# Patient Record
Sex: Female | Born: 1990
Health system: Southern US, Community
[De-identification: ages and names within clinical notes are randomized; demographics above are authoritative.]

## PROBLEM LIST (undated history)

## (undated) ENCOUNTER — Inpatient Hospital Stay (HOSPITAL_COMMUNITY): Payer: Self-pay

## (undated) DIAGNOSIS — F419 Anxiety disorder, unspecified: Secondary | ICD-10-CM

## (undated) DIAGNOSIS — K8020 Calculus of gallbladder without cholecystitis without obstruction: Secondary | ICD-10-CM

## (undated) DIAGNOSIS — U071 COVID-19: Secondary | ICD-10-CM

## (undated) DIAGNOSIS — K219 Gastro-esophageal reflux disease without esophagitis: Secondary | ICD-10-CM

## (undated) DIAGNOSIS — E669 Obesity, unspecified: Secondary | ICD-10-CM

## (undated) HISTORY — DX: Obesity, unspecified: E66.9

## (undated) HISTORY — PX: NO PAST SURGERIES: SHX2092

## (undated) HISTORY — DX: Gastro-esophageal reflux disease without esophagitis: K21.9

## (undated) HISTORY — PX: WISDOM TOOTH EXTRACTION: SHX21

## (undated) HISTORY — DX: Calculus of gallbladder without cholecystitis without obstruction: K80.20

---

## 2012-06-30 NOTE — L&D Delivery Note (Signed)
Delivery Note   Suzanne [name], Suzanne [name] [[id number]]  Pt progressed to complete and pushed for about 2 hrs, brought vertex to +2 to +3, became very tired.  Offered vacuum assistance, discussed procedure and risks, verbal consent obtained. Adequate epidural, bladder empty.  Mushroom cup applied, with 2 ctx vertex brought to perineum with vacuum in green zone.  Vacuum removed and vertex delivered.   At 2:23 AM a viable female was delivered via Vaginal, Vacuum (Extractor) (Presentation: Left Occiput Posterior).  APGAR: 9, 9; weight 5 lb 14 oz (2665 g).   Placenta status: Intact, Spontaneous.  Cord: 3 vessels with the following complications: None.     Suzanne [name], Suzanne [name] [[id number]]  Baby B remained vertex, AROM clear fluid.  She only had to push with one ctx.  At 2:29 AM a viable female was delivered via Vaginal, Spontaneous Delivery (Presentation: Middle Occiput Anterior).  APGAR: 9, 9; weight 6 lb 0.1 oz (2724 g).   Placenta status: Intact, Spontaneous.  Cord: 3 vessels with the following complications: None.  Anesthesia: Epidural  Episiotomy: None Lacerations: 2nd degree and left vaginal sulcus Suture Repair: 2.0 vicryl and 3-0 Vicryl Rapide Est. Blood Loss (mL): 500   Mom to postpartum.   Baby A to stay with mom.   Baby B to stay with mom. Discussed circumcision procedure and risks, consent signed.    Vlasta Baskin D 01/14/2013, 3:07 AM

## 2012-07-20 LAB — OB RESULTS CONSOLE ABO/RH: RH Type: POSITIVE

## 2012-11-25 ENCOUNTER — Other Ambulatory Visit (HOSPITAL_COMMUNITY): Payer: Self-pay | Admitting: Obstetrics and Gynecology

## 2012-11-25 DIAGNOSIS — Z3183 Encounter for assisted reproductive fertility procedure cycle: Secondary | ICD-10-CM

## 2012-11-30 ENCOUNTER — Ambulatory Visit (HOSPITAL_COMMUNITY): Admission: RE | Admit: 2012-11-30 | Payer: Self-pay | Source: Ambulatory Visit

## 2012-12-28 LAB — OB RESULTS CONSOLE GC/CHLAMYDIA
Chlamydia: NEGATIVE
Gonorrhea: NEGATIVE

## 2012-12-28 LAB — OB RESULTS CONSOLE RPR: RPR: NONREACTIVE

## 2012-12-28 LAB — OB RESULTS CONSOLE GBS: GBS: NEGATIVE

## 2012-12-29 ENCOUNTER — Inpatient Hospital Stay (HOSPITAL_COMMUNITY)
Admission: AD | Admit: 2012-12-29 | Discharge: 2012-12-29 | Disposition: A | Discharge status: Home / Self Care | Payer: BC Managed Care – PPO | Source: Ambulatory Visit | Attending: Obstetrics and Gynecology | Admitting: Obstetrics and Gynecology

## 2012-12-29 ENCOUNTER — Encounter (HOSPITAL_COMMUNITY): Payer: Self-pay

## 2012-12-29 DIAGNOSIS — O4703 False labor before 37 completed weeks of gestation, third trimester: Secondary | ICD-10-CM

## 2012-12-29 DIAGNOSIS — O4700 False labor before 37 completed weeks of gestation, unspecified trimester: Secondary | ICD-10-CM | POA: Insufficient documentation

## 2012-12-29 DIAGNOSIS — O30009 Twin pregnancy, unspecified number of placenta and unspecified number of amniotic sacs, unspecified trimester: Secondary | ICD-10-CM | POA: Insufficient documentation

## 2012-12-29 DIAGNOSIS — O479 False labor, unspecified: Secondary | ICD-10-CM

## 2012-12-29 NOTE — MAU Note (Signed)
Rates ctx's 6/10, intermittent

## 2012-12-29 NOTE — MAU Provider Note (Signed)
  History     CSN: [id number]  Arrival date and time: 12/29/12 0605   First Provider Initiated Contact with Patient 12/29/12 [phone]      Chief Complaint  Patient presents with  . Contractions   HPI Ms. Suzanne [name] is a 22 y.o. G1P0 at [redacted]w[redacted]d with twins who presents to MAU today with complaint of contractions. The patient states UCs started around 0300 and are q 6 minutes. She rates her pain at 7/10 with contractions. She was dilated 1 cm in the office yesterday. She denies bleeding, discharge or LOF. She reports good fetal movement.   OB History   Grav Para Term Preterm Abortions TAB SAB Ect Mult Living   1               Past Medical History  Diagnosis Date  . Medical history non-contributory     Past Surgical History  Procedure Laterality Date  . No past surgeries      History reviewed. No pertinent family history.  History  Substance Use Topics  . Smoking status: Never Smoker   . Smokeless tobacco: Not on file  . Alcohol Use: No    Allergies: No Known Allergies  Prescriptions prior to admission  Medication Sig Dispense Refill  . Prenatal Vit-Fe Fumarate-FA (MULTIVITAMIN-PRENATAL) 27-0.8 MG TABS Take 1 tablet by mouth daily at 12 noon.        Review of Systems  Gastrointestinal: Positive for abdominal pain.  Genitourinary:       Neg - bleeding, discharge, LOF   Physical Exam   Blood pressure 109/74, pulse 121, temperature 98.6 F (37 C), temperature source Oral, resp. rate 18, last menstrual period 04/23/2012, SpO2 100.00%.  Physical Exam  Constitutional: She is oriented to person, place, and time. She appears well-developed and well-nourished. No distress.  HENT:  Head: Normocephalic and atraumatic.  Cardiovascular: Normal rate, regular rhythm and normal heart sounds.   Respiratory: Effort normal and breath sounds normal. No respiratory distress.  GI: Soft. She exhibits no distension and no mass. There is no tenderness. There is no rebound and no  guarding.  Neurological: She is alert and oriented to person, place, and time.  Skin: Skin is warm and dry. No erythema.  Psychiatric: She has a normal mood and affect.  Dilation: 1 Effacement (%): 20;30 Cervical Position: Posterior Station: -3 Exam by:: Naaman Plummer PA   Fetal Monitoring: Baseline A:130, moderate variability, + accelerations, no decelerations Baseline B: 135, moderate variability, + accelerations, no decelerations Contractions: q 2-5 minutes  MAU Course  Procedures None  MDM Discussed with Dr. Senaida Ores. Ok for discharge with labor precautions. Follow-up in office as scheduled or sooner if symptoms worsen or persist.  Ok to offer Ambien if patient desires. Patient declines at this time.  Assessment and Plan  A: Preterm contractions, third trimester  P: Discharge home Labor precautions and kick counts discussed Patient encouraged to follow-up in the office as scheduled or sooner if symptoms worsen or persist Patient may return to MAU as needed  Freddi Starr, PA-C  12/29/2012, 7:18 AM

## 2012-12-29 NOTE — MAU Note (Signed)
Pt states UC began around 430am and are every 5 minutes apart. Denies LOF and vaginal bleeding. States good FM.

## 2013-01-04 ENCOUNTER — Encounter (HOSPITAL_COMMUNITY): Payer: Self-pay | Admitting: *Deleted

## 2013-01-04 ENCOUNTER — Telehealth (HOSPITAL_COMMUNITY): Payer: Self-pay | Admitting: *Deleted

## 2013-01-04 NOTE — Telephone Encounter (Signed)
Preadmission screen  

## 2013-01-13 ENCOUNTER — Inpatient Hospital Stay (HOSPITAL_COMMUNITY)
Admission: RE | Admit: 2013-01-13 | Discharge: 2013-01-16 | DRG: 373 | Disposition: A | Discharge status: Home / Self Care | Payer: BC Managed Care – PPO | Source: Ambulatory Visit | Attending: Obstetrics and Gynecology | Admitting: Obstetrics and Gynecology

## 2013-01-13 ENCOUNTER — Encounter (HOSPITAL_COMMUNITY): Payer: Self-pay

## 2013-01-13 ENCOUNTER — Inpatient Hospital Stay (HOSPITAL_COMMUNITY): Payer: BC Managed Care – PPO | Admitting: Anesthesiology

## 2013-01-13 ENCOUNTER — Encounter (HOSPITAL_COMMUNITY): Payer: Self-pay | Admitting: Anesthesiology

## 2013-01-13 DIAGNOSIS — O30049 Twin pregnancy, dichorionic/diamniotic, unspecified trimester: Secondary | ICD-10-CM

## 2013-01-13 DIAGNOSIS — O30009 Twin pregnancy, unspecified number of placenta and unspecified number of amniotic sacs, unspecified trimester: Secondary | ICD-10-CM | POA: Diagnosis present

## 2013-01-13 LAB — CBC
HCT: 31.6 % — ABNORMAL LOW (ref 36.0–46.0)
Hemoglobin: 10.3 g/dL — ABNORMAL LOW (ref 12.0–15.0)
MCH: 23.8 pg — ABNORMAL LOW (ref 26.0–34.0)
MCHC: 32.6 g/dL (ref 30.0–36.0)
RDW: 15.2 % (ref 11.5–15.5)

## 2013-01-13 LAB — TYPE AND SCREEN

## 2013-01-13 MED ORDER — PHENYLEPHRINE 40 MCG/ML (10ML) SYRINGE FOR IV PUSH (FOR BLOOD PRESSURE SUPPORT)
80.0000 ug | PREFILLED_SYRINGE | INTRAVENOUS | Status: DC | PRN
  Filled 2013-01-13: qty 2

## 2013-01-13 MED ORDER — OXYTOCIN 40 UNITS IN LACTATED RINGERS INFUSION - SIMPLE MED
1.0000 m[IU]/min | INTRAVENOUS | Status: DC
  Administered 2013-01-13: 2 m[IU]/min via INTRAVENOUS
  Filled 2013-01-13: qty 1000

## 2013-01-13 MED ORDER — LACTATED RINGERS IV SOLN
500.0000 mL | INTRAVENOUS | Status: DC | PRN
  Administered 2013-01-13: 700 mL via INTRAVENOUS
  Administered 2013-01-14: 500 mL via INTRAVENOUS

## 2013-01-13 MED ORDER — CITRIC ACID-SODIUM CITRATE 334-500 MG/5ML PO SOLN: 30.0000 mL | ORAL | Status: DC | PRN

## 2013-01-13 MED ORDER — LACTATED RINGERS IV SOLN
INTRAVENOUS | Status: DC
  Administered 2013-01-13 (×3): via INTRAVENOUS

## 2013-01-13 MED ORDER — OXYCODONE-ACETAMINOPHEN 5-325 MG PO TABS: 1.0000 | ORAL_TABLET | ORAL | Status: DC | PRN

## 2013-01-13 MED ORDER — DIPHENHYDRAMINE HCL 50 MG/ML IJ SOLN: 12.5000 mg | INTRAMUSCULAR | Status: DC | PRN

## 2013-01-13 MED ORDER — TERBUTALINE SULFATE 1 MG/ML IJ SOLN: 0.2500 mg | Freq: Once | INTRAMUSCULAR | Status: AC | PRN

## 2013-01-13 MED ORDER — FENTANYL 2.5 MCG/ML BUPIVACAINE 1/10 % EPIDURAL INFUSION (WH - ANES)
14.0000 mL/h | INTRAMUSCULAR | Status: DC | PRN
  Filled 2013-01-13: qty 125

## 2013-01-13 MED ORDER — OXYTOCIN BOLUS FROM INFUSION
500.0000 mL | INTRAVENOUS | Status: DC
  Administered 2013-01-14: 500 mL via INTRAVENOUS

## 2013-01-13 MED ORDER — ONDANSETRON HCL 4 MG/2ML IJ SOLN: 4.0000 mg | Freq: Four times a day (QID) | INTRAMUSCULAR | Status: DC | PRN

## 2013-01-13 MED ORDER — EPHEDRINE 5 MG/ML INJ
10.0000 mg | INTRAVENOUS | Status: DC | PRN
  Filled 2013-01-13: qty 2

## 2013-01-13 MED ORDER — EPHEDRINE 5 MG/ML INJ
10.0000 mg | INTRAVENOUS | Status: DC | PRN
  Filled 2013-01-13 (×2): qty 4
  Filled 2013-01-13: qty 2

## 2013-01-13 MED ORDER — LACTATED RINGERS IV SOLN: 500.0000 mL | Freq: Once | INTRAVENOUS | Status: DC

## 2013-01-13 MED ORDER — FENTANYL 2.5 MCG/ML BUPIVACAINE 1/10 % EPIDURAL INFUSION (WH - ANES)
INTRAMUSCULAR | Status: DC | PRN
  Administered 2013-01-13: 6 mL/h via EPIDURAL

## 2013-01-13 MED ORDER — ACETAMINOPHEN 325 MG PO TABS: 650.0000 mg | ORAL_TABLET | ORAL | Status: DC | PRN

## 2013-01-13 MED ORDER — OXYTOCIN 40 UNITS IN LACTATED RINGERS INFUSION - SIMPLE MED: 62.5000 mL/h | INTRAVENOUS | Status: DC

## 2013-01-13 MED ORDER — IBUPROFEN 600 MG PO TABS: 600.0000 mg | ORAL_TABLET | Freq: Four times a day (QID) | ORAL | Status: DC | PRN

## 2013-01-13 MED ORDER — PHENYLEPHRINE 40 MCG/ML (10ML) SYRINGE FOR IV PUSH (FOR BLOOD PRESSURE SUPPORT)
80.0000 ug | PREFILLED_SYRINGE | INTRAVENOUS | Status: DC | PRN
  Filled 2013-01-13 (×2): qty 5
  Filled 2013-01-13: qty 2

## 2013-01-13 MED ORDER — LIDOCAINE HCL (PF) 1 % IJ SOLN
30.0000 mL | INTRAMUSCULAR | Status: AC | PRN
  Administered 2013-01-13 (×2): 3 mL via SUBCUTANEOUS

## 2013-01-13 NOTE — Progress Notes (Signed)
Feeling ctx Afeb, VSS FHT- Cat I x 2, ctx q 3 min VE- 3/70/-2, vtx, AROM clear Continue pitocin and monitor progress

## 2013-01-13 NOTE — Anesthesia Preprocedure Evaluation (Signed)
Anesthesia Evaluation  Patient identified by MRN, date of birth, ID band Patient awake    Reviewed: Allergy & Precautions, H&P , NPO status , Patient's Chart, lab work & pertinent test results  Airway Mallampati: III TM Distance: >3 FB Neck ROM: full    Dental no notable dental hx.    Pulmonary neg pulmonary ROS,    Pulmonary exam normal       Cardiovascular negative cardio ROS  Rate:Normal     Neuro/Psych negative neurological ROS  negative psych ROS   GI/Hepatic negative GI ROS, Neg liver ROS,   Endo/Other  Morbid obesity  Renal/GU negative Renal ROS  negative genitourinary   Musculoskeletal negative musculoskeletal ROS (+)   Abdominal (+) + obese,   Peds negative pediatric ROS (+)  Hematology negative hematology ROS (+)   Anesthesia Other Findings   Reproductive/Obstetrics (+) Pregnancy                           Anesthesia Physical Anesthesia Plan  ASA: III  Anesthesia Plan: Epidural   Post-op Pain Management:    Induction:   Airway Management Planned:   Additional Equipment:   Intra-op Plan:   Post-operative Plan:   Informed Consent: I have reviewed the patients History and Physical, chart, labs and discussed the procedure including the risks, benefits and alternatives for the proposed anesthesia with the patient or authorized representative who has indicated his/her understanding and acceptance.     Plan Discussed with:   Anesthesia Plan Comments:         Anesthesia Quick Evaluation

## 2013-01-13 NOTE — Anesthesia Procedure Notes (Signed)
Epidural Patient location during procedure: OB Start time: 01/13/2013 4:50 PM End time: 01/13/2013 4:56 PM  Staffing Anesthesiologist: Sandrea Hughs Performed by: other anesthesia staff   Preanesthetic Checklist Completed: patient identified, surgical consent, pre-op evaluation, timeout performed, IV checked, risks and benefits discussed and monitors and equipment checked  Epidural Patient position: sitting Prep: site prepped and draped and DuraPrep Patient monitoring: continuous pulse ox and blood pressure Approach: midline Injection technique: LOR air  Needle:  Needle type: Tuohy  Needle gauge: 17 G Needle length: 9 cm and 9 Needle insertion depth: 8 cm Catheter type: closed end flexible Catheter size: 19 Gauge Catheter at skin depth: 13 cm Test dose: negative and Other  Assessment Sensory level: T10 Events: blood not aspirated, injection not painful, no injection resistance, negative IV test and no paresthesia  Additional Notes After clearing the end of the needle with small stylet CSF appeared flowing out of the hub. The needle was immediately withdrawn to - CSF and the epidural catheter was placed. The needle was withdrawn one cm and is currently 13cm at the skin or 6cm in the epidural space. Pt had no response to the first 3cc of 1% lido through the epidural catheter but had no pain with contractions after the second dose of the same medicine. BP remained stable in the 120-130/ 80 - 90 after dosing. Patient was notified that she was at risk for having a headache.Reason for block:procedure for pain

## 2013-01-13 NOTE — H&P (Signed)
Suzanne [name] is a 22 y.o. female, G1P0, EGA 37+ weeks with diamniotic/dichorionic twins with EDC 8-1 presenting for elective induction with favorable cervix.  Concordant growth of twins, vtx/vtx, no problems with pregnancy.  See prenatal records for complete history.  Maternal Medical History:  Fetal activity: Perceived fetal activity is normal.      OB History   Grav Para Term Preterm Abortions TAB SAB Ect Mult Living   1              Past Medical History  Diagnosis Date  . Medical history non-contributory    Past Surgical History  Procedure Laterality Date  . No past surgeries     Family History: family history includes Diabetes in her father. Social History:  reports that she has never smoked. She has never used smokeless tobacco. She reports that she does not drink alcohol or use illicit drugs.   Prenatal Transfer Tool  Maternal Diabetes: No Genetic Screening: Normal Maternal Ultrasounds/Referrals: Normal Fetal Ultrasounds or other Referrals:  None Maternal Substance Abuse:  No Significant Maternal Medications:  None Significant Maternal Lab Results:  Lab values include: Group B Strep negative Other Comments:  di/di twins  Review of Systems  Respiratory: Negative.   Cardiovascular: Negative.       Blood pressure 119/86, pulse 107, temperature 98.8 F (37.1 C), temperature source Oral, resp. rate 20, height 5\' 3"  (1.6 m), weight 113.399 kg (250 lb), last menstrual period 04/23/2012. Maternal Exam:  Uterine Assessment: Contraction strength is mild.  Contraction frequency is irregular.   Abdomen: Patient reports no abdominal tenderness. Fetal presentation: vertex  Introitus: Normal vulva. Normal vagina.  Pelvis: adequate for delivery.   Cervix: Cervix evaluated by digital exam.     Fetal Exam Fetal Monitor Review: Mode: ultrasound.   Baseline rate: 130 and 140.  Variability: moderate (6-25 bpm).   Pattern: accelerations present and no decelerations.    Fetal  State Assessment: Category I - tracings are normal.     Physical Exam  Constitutional: She appears well-developed and well-nourished.  Cardiovascular: Normal rate, regular rhythm and normal heart sounds.   No murmur heard. Respiratory: Effort normal and breath sounds normal. No respiratory distress. She has no wheezes.  GI: Soft.  gravid    Prenatal labs: ABO, Rh: A/Positive/-- (01/21 0000) Antibody: Negative (01/21 0000) Rubella: Immune (01/21 0000) RPR: Nonreactive (07/01 0000)  HBsAg: Negative (01/21 0000)  HIV: Non-reactive (07/01 0000)  GBS: Negative (07/01 0000)  GCT:  94  Assessment/Plan: Di/di twin IUP, vtx/vtx for induction.  Will start pitocin and monitor progress, anticipate SVD in the room.     Gali Spinney D 01/13/2013, 8:26 AM

## 2013-01-13 NOTE — Progress Notes (Signed)
Just got epidural Afeb, VSS FHT- Cat I x 2, ctx q 2-3 min VE-5/70/-2 vtx per RN Continue pitocin, monitor progress

## 2013-01-14 ENCOUNTER — Encounter (HOSPITAL_COMMUNITY): Payer: Self-pay

## 2013-01-14 MED ORDER — BENZOCAINE-MENTHOL 20-0.5 % EX AERO
1.0000 "application " | INHALATION_SPRAY | CUTANEOUS | Status: DC | PRN
  Administered 2013-01-14: 1 via TOPICAL
  Filled 2013-01-14: qty 56

## 2013-01-14 MED ORDER — DIPHENHYDRAMINE HCL 25 MG PO CAPS: 25.0000 mg | ORAL_CAPSULE | Freq: Four times a day (QID) | ORAL | Status: DC | PRN

## 2013-01-14 MED ORDER — SIMETHICONE 80 MG PO CHEW: 80.0000 mg | CHEWABLE_TABLET | ORAL | Status: DC | PRN

## 2013-01-14 MED ORDER — LIDOCAINE HCL (PF) 1 % IJ SOLN
INTRAMUSCULAR | Status: AC
  Administered 2013-01-14: 30 mL
  Filled 2013-01-14: qty 30

## 2013-01-14 MED ORDER — METHYLERGONOVINE MALEATE 0.2 MG PO TABS: 0.2000 mg | ORAL_TABLET | ORAL | Status: DC | PRN

## 2013-01-14 MED ORDER — SENNOSIDES-DOCUSATE SODIUM 8.6-50 MG PO TABS
2.0000 | ORAL_TABLET | Freq: Every day | ORAL | Status: DC
  Administered 2013-01-14 – 2013-01-15 (×2): 2 via ORAL

## 2013-01-14 MED ORDER — MAGNESIUM HYDROXIDE 400 MG/5ML PO SUSP: 30.0000 mL | ORAL | Status: DC | PRN

## 2013-01-14 MED ORDER — MEASLES, MUMPS & RUBELLA VAC ~~LOC~~ INJ
0.5000 mL | INJECTION | Freq: Once | SUBCUTANEOUS | Status: DC
  Filled 2013-01-14: qty 0.5

## 2013-01-14 MED ORDER — ZOLPIDEM TARTRATE 5 MG PO TABS: 5.0000 mg | ORAL_TABLET | Freq: Every evening | ORAL | Status: DC | PRN

## 2013-01-14 MED ORDER — ONDANSETRON HCL 4 MG/2ML IJ SOLN: 4.0000 mg | INTRAMUSCULAR | Status: DC | PRN

## 2013-01-14 MED ORDER — LANOLIN HYDROUS EX OINT: TOPICAL_OINTMENT | CUTANEOUS | Status: DC | PRN

## 2013-01-14 MED ORDER — DIBUCAINE 1 % RE OINT: 1.0000 "application " | TOPICAL_OINTMENT | RECTAL | Status: DC | PRN

## 2013-01-14 MED ORDER — IBUPROFEN 600 MG PO TABS
600.0000 mg | ORAL_TABLET | Freq: Four times a day (QID) | ORAL | Status: DC
  Administered 2013-01-14 – 2013-01-16 (×9): 600 mg via ORAL
  Filled 2013-01-14 (×10): qty 1

## 2013-01-14 MED ORDER — OXYCODONE-ACETAMINOPHEN 5-325 MG PO TABS
1.0000 | ORAL_TABLET | ORAL | Status: DC | PRN
  Administered 2013-01-15 – 2013-01-16 (×2): 1 via ORAL
  Filled 2013-01-14 (×2): qty 1

## 2013-01-14 MED ORDER — PRENATAL MULTIVITAMIN CH
1.0000 | ORAL_TABLET | Freq: Every day | ORAL | Status: DC
  Administered 2013-01-14 – 2013-01-15 (×2): 1 via ORAL
  Filled 2013-01-14 (×3): qty 1

## 2013-01-14 MED ORDER — WITCH HAZEL-GLYCERIN EX PADS: 1.0000 "application " | MEDICATED_PAD | CUTANEOUS | Status: DC | PRN

## 2013-01-14 MED ORDER — METHYLERGONOVINE MALEATE 0.2 MG/ML IJ SOLN: 0.2000 mg | INTRAMUSCULAR | Status: DC | PRN

## 2013-01-14 MED ORDER — TETANUS-DIPHTH-ACELL PERTUSSIS 5-2.5-18.5 LF-MCG/0.5 IM SUSP: 0.5000 mL | Freq: Once | INTRAMUSCULAR | Status: DC

## 2013-01-14 MED ORDER — ONDANSETRON HCL 4 MG PO TABS: 4.0000 mg | ORAL_TABLET | ORAL | Status: DC | PRN

## 2013-01-14 NOTE — Progress Notes (Signed)
PPD #0 No problems Afeb, VSS Fundus firm, NT at U-0 Continue routine postpartum care

## 2013-01-14 NOTE — Anesthesia Postprocedure Evaluation (Signed)
  Anesthesia Post-op Note  Patient: Chiropractor  Procedure(s) Performed: * No procedures listed *  Patient Location: PACU and Mother/Baby  Anesthesia Type:Epidural  Level of Consciousness: awake, alert  and oriented  Airway and Oxygen Therapy: Patient Spontanous Breathing  Post-op Pain: none  Post-op Assessment: Post-op Vital signs reviewed, Patient's Cardiovascular Status Stable, No headache, No backache, No residual numbness and No residual motor weakness  Post-op Vital Signs: Reviewed and stable  Complications: No apparent anesthesia complications

## 2013-01-15 LAB — CBC
HCT: 24.9 % — ABNORMAL LOW (ref 36.0–46.0)
MCV: 73 fL — ABNORMAL LOW (ref 78.0–100.0)
RBC: 3.41 MIL/uL — ABNORMAL LOW (ref 3.87–5.11)
WBC: 10.9 10*3/uL — ABNORMAL HIGH (ref 4.0–10.5)

## 2013-01-15 NOTE — Progress Notes (Signed)
Post Partum Day 1 Subjective: no complaints, up ad lib and tolerating PO  Objective: Blood pressure 119/75, pulse 93, temperature 98.1 F (36.7 C), temperature source Oral, resp. rate 18, height 5\' 3"  (1.6 m), weight 113.399 kg (250 lb), last menstrual period 04/23/2012, SpO2 100.00%, unknown if currently breastfeeding.  Physical Exam:  General: alert and cooperative Lochia: appropriate Uterine Fundus: firm    Recent Labs  01/13/13 0900 01/15/13 0630  HGB 10.3* 8.0*  HCT 31.6* 24.9*    Assessment/Plan: Plan for discharge tomorrow Working on breastfeeding   LOS: 2 days   Talan Gildner W 01/15/2013, 8:55 AM

## 2013-01-16 MED ORDER — IBUPROFEN 600 MG PO TABS: 600.0000 mg | ORAL_TABLET | Freq: Four times a day (QID) | ORAL | Status: DC

## 2013-01-16 MED ORDER — OXYCODONE-ACETAMINOPHEN 5-325 MG PO TABS: 1.0000 | ORAL_TABLET | ORAL | Status: DC | PRN

## 2013-01-16 NOTE — Discharge Summary (Signed)
Obstetric Discharge Summary Reason for Admission: induction of labor Prenatal Procedures: ultrasound Intrapartum Procedures: spontaneous vaginal delivery x1, vacuum assisted delivery x 1 Postpartum Procedures: none Complications-Operative and Postpartum: second degree perineal laceration Hemoglobin  Date Value Range Status  01/15/2013 8.0* 12.0 - 15.0 g/dL Final     REPEATED TO VERIFY     DELTA CHECK NOTED     HCT  Date Value Range Status  01/15/2013 24.9* 36.0 - 46.0 % Final    Physical Exam:  General: alert and cooperative Lochia: appropriate Uterine Fundus: firm   Discharge Diagnoses: Term Pregnancy-delivered  Discharge Information: Date: 01/16/2013 Activity: pelvic rest Diet: routine Medications: Ibuprofen and Percocet Condition: improved Instructions: refer to practice specific booklet Discharge to: home Follow-up Information   Follow up with MEISINGER,TODD D, MD. Schedule an appointment as soon as possible for a visit in 6 weeks. (postpartum check)    Contact information:   [address] [place] [address] [phone]       Newborn Data:   Yeimi, Debnam [[id number]]  Live born female  Birth Weight: 5 lb 14 oz (2665 g) APGAR: 9, 9   Corleen, Otwell [[id number]]  Live born female  Birth Weight: 6 lb 0.1 oz (2724 g) APGAR: 9, 9  Home with mother.  Oliver Pila 01/16/2013, 11:13 AM

## 2013-01-16 NOTE — Progress Notes (Signed)
Post Partum Day 2 Subjective: up ad lib and tolerating PO  Objective: Blood pressure 107/75, pulse 89, temperature 98.2 F (36.8 C), temperature source Oral, resp. rate 18, height 5\' 3"  (1.6 m), weight 113.399 kg (250 lb), last menstrual period 04/23/2012, SpO2 100.00%, unknown if currently breastfeeding.  Physical Exam:  General: alert and cooperative Lochia: appropriate Uterine Fundus: firm   Recent Labs  01/15/13 0630  HGB 8.0*  HCT 24.9*    Assessment/Plan: Discharge home and Breastfeeding   LOS: 3 days   Celese Banner W 01/16/2013, 11:05 AM

## 2013-01-28 ENCOUNTER — Inpatient Hospital Stay (HOSPITAL_COMMUNITY): Admission: AD | Admit: 2013-01-28 | Payer: Self-pay | Source: Ambulatory Visit | Admitting: Obstetrics and Gynecology

## 2014-05-01 ENCOUNTER — Encounter (HOSPITAL_COMMUNITY): Payer: Self-pay

## 2015-10-24 DIAGNOSIS — Z1151 Encounter for screening for human papillomavirus (HPV): Secondary | ICD-10-CM | POA: Diagnosis not present

## 2015-10-24 DIAGNOSIS — Z130 Encounter for screening for diseases of the blood and blood-forming organs and certain disorders involving the immune mechanism: Secondary | ICD-10-CM | POA: Diagnosis not present

## 2015-10-24 DIAGNOSIS — Z6841 Body Mass Index (BMI) 40.0 and over, adult: Secondary | ICD-10-CM | POA: Diagnosis not present

## 2015-10-24 DIAGNOSIS — R8299 Other abnormal findings in urine: Secondary | ICD-10-CM | POA: Diagnosis not present

## 2015-10-24 DIAGNOSIS — Z1389 Encounter for screening for other disorder: Secondary | ICD-10-CM | POA: Diagnosis not present

## 2015-10-24 DIAGNOSIS — Z124 Encounter for screening for malignant neoplasm of cervix: Secondary | ICD-10-CM | POA: Diagnosis not present

## 2015-10-24 DIAGNOSIS — Z308 Encounter for other contraceptive management: Secondary | ICD-10-CM | POA: Diagnosis not present

## 2015-10-24 DIAGNOSIS — Z3046 Encounter for surveillance of implantable subdermal contraceptive: Secondary | ICD-10-CM | POA: Diagnosis not present

## 2015-10-24 DIAGNOSIS — Z01419 Encounter for gynecological examination (general) (routine) without abnormal findings: Secondary | ICD-10-CM | POA: Diagnosis not present

## 2015-10-31 DIAGNOSIS — Z6841 Body Mass Index (BMI) 40.0 and over, adult: Secondary | ICD-10-CM | POA: Diagnosis not present

## 2015-10-31 DIAGNOSIS — R635 Abnormal weight gain: Secondary | ICD-10-CM | POA: Diagnosis not present

## 2016-03-27 DIAGNOSIS — N911 Secondary amenorrhea: Secondary | ICD-10-CM | POA: Diagnosis not present

## 2016-03-27 DIAGNOSIS — Z3201 Encounter for pregnancy test, result positive: Secondary | ICD-10-CM | POA: Diagnosis not present

## 2016-04-03 DIAGNOSIS — Z3A09 9 weeks gestation of pregnancy: Secondary | ICD-10-CM | POA: Diagnosis not present

## 2016-04-03 DIAGNOSIS — O30041 Twin pregnancy, dichorionic/diamniotic, first trimester: Secondary | ICD-10-CM | POA: Diagnosis not present

## 2016-04-10 DIAGNOSIS — Z362 Encounter for other antenatal screening follow-up: Secondary | ICD-10-CM | POA: Diagnosis not present

## 2016-04-10 DIAGNOSIS — Z3A09 9 weeks gestation of pregnancy: Secondary | ICD-10-CM | POA: Diagnosis not present

## 2016-04-10 DIAGNOSIS — O30041 Twin pregnancy, dichorionic/diamniotic, first trimester: Secondary | ICD-10-CM | POA: Diagnosis not present

## 2016-04-10 LAB — OB RESULTS CONSOLE ABO/RH: RH Type: POSITIVE

## 2016-04-10 LAB — OB RESULTS CONSOLE GC/CHLAMYDIA
Chlamydia: NEGATIVE
GC PROBE AMP, GENITAL: NEGATIVE

## 2016-04-10 LAB — OB RESULTS CONSOLE RUBELLA ANTIBODY, IGM: Rubella: IMMUNE

## 2016-04-10 LAB — OB RESULTS CONSOLE HIV ANTIBODY (ROUTINE TESTING): HIV: NONREACTIVE

## 2016-04-10 LAB — OB RESULTS CONSOLE RPR: RPR: NONREACTIVE

## 2016-04-10 LAB — OB RESULTS CONSOLE HEPATITIS B SURFACE ANTIGEN: Hepatitis B Surface Ag: NEGATIVE

## 2016-04-10 LAB — OB RESULTS CONSOLE ANTIBODY SCREEN: Antibody Screen: NEGATIVE

## 2016-04-28 DIAGNOSIS — Z3682 Encounter for antenatal screening for nuchal translucency: Secondary | ICD-10-CM | POA: Diagnosis not present

## 2016-04-28 DIAGNOSIS — Z3A12 12 weeks gestation of pregnancy: Secondary | ICD-10-CM | POA: Diagnosis not present

## 2016-04-28 DIAGNOSIS — Z362 Encounter for other antenatal screening follow-up: Secondary | ICD-10-CM | POA: Diagnosis not present

## 2016-04-28 DIAGNOSIS — O30041 Twin pregnancy, dichorionic/diamniotic, first trimester: Secondary | ICD-10-CM | POA: Diagnosis not present

## 2016-06-16 DIAGNOSIS — Z3A19 19 weeks gestation of pregnancy: Secondary | ICD-10-CM | POA: Diagnosis not present

## 2016-06-16 DIAGNOSIS — Z363 Encounter for antenatal screening for malformations: Secondary | ICD-10-CM | POA: Diagnosis not present

## 2016-06-16 DIAGNOSIS — O30042 Twin pregnancy, dichorionic/diamniotic, second trimester: Secondary | ICD-10-CM | POA: Diagnosis not present

## 2016-06-26 ENCOUNTER — Encounter (HOSPITAL_COMMUNITY): Payer: Self-pay | Admitting: *Deleted

## 2016-06-26 ENCOUNTER — Inpatient Hospital Stay (HOSPITAL_COMMUNITY)
Admission: AD | Admit: 2016-06-26 | Discharge: 2016-06-26 | Disposition: A | Discharge status: Home / Self Care | Payer: 59 | Source: Ambulatory Visit | Attending: Obstetrics and Gynecology | Admitting: Obstetrics and Gynecology

## 2016-06-26 DIAGNOSIS — Z3A21 21 weeks gestation of pregnancy: Secondary | ICD-10-CM | POA: Diagnosis not present

## 2016-06-26 DIAGNOSIS — O26892 Other specified pregnancy related conditions, second trimester: Secondary | ICD-10-CM | POA: Diagnosis not present

## 2016-06-26 DIAGNOSIS — Z833 Family history of diabetes mellitus: Secondary | ICD-10-CM | POA: Diagnosis not present

## 2016-06-26 DIAGNOSIS — O30042 Twin pregnancy, dichorionic/diamniotic, second trimester: Secondary | ICD-10-CM | POA: Insufficient documentation

## 2016-06-26 DIAGNOSIS — O26899 Other specified pregnancy related conditions, unspecified trimester: Secondary | ICD-10-CM | POA: Diagnosis not present

## 2016-06-26 DIAGNOSIS — R102 Pelvic and perineal pain: Secondary | ICD-10-CM | POA: Diagnosis not present

## 2016-06-26 LAB — URINALYSIS, ROUTINE W REFLEX MICROSCOPIC
BILIRUBIN URINE: NEGATIVE
GLUCOSE, UA: NEGATIVE mg/dL
HGB URINE DIPSTICK: NEGATIVE
Ketones, ur: 20 mg/dL — AB
NITRITE: NEGATIVE
PROTEIN: NEGATIVE mg/dL
Specific Gravity, Urine: 1.014 (ref 1.005–1.030)
pH: 7 (ref 5.0–8.0)

## 2016-06-26 LAB — WET PREP, GENITAL
Clue Cells Wet Prep HPF POC: NONE SEEN
SPERM: NONE SEEN
Trich, Wet Prep: NONE SEEN
YEAST WET PREP: NONE SEEN

## 2016-06-26 LAB — GC/CHLAMYDIA PROBE AMP (~~LOC~~) NOT AT ARMC
Chlamydia: NEGATIVE
NEISSERIA GONORRHEA: NEGATIVE

## 2016-06-26 NOTE — Progress Notes (Addendum)
G2P1 twin IUP @ 21+[redacted] wksga. Presents to triage for lower abdominal pressure. Denies LOF or bleeding.Marland Kitchen. +FM Baby A FHT 150 and baby B 160 with doptones. No ctx noted  0500: Provider at bs for Spec exam, culture and wet prep and sent to lab. SVE closed.   0530: large cups of whater provided.  [id number] Discharge instructions provided with patient understanding. Pt left unit via ambulatatory

## 2016-06-26 NOTE — Discharge Instructions (Signed)

## 2016-06-26 NOTE — MAU Provider Note (Signed)
History     CSN: [id number]  Arrival date and time: 06/26/16 [id number]   First Provider Initiated Contact with Patient 06/26/16 0457      Chief Complaint  Patient presents with  . Pelvic Pain    pain felt last night and progressively worse   Suzanne [name] is a 25 y.o. G2P1002 at 425w1d with di/di twins who presents today with pressure. She states that it started around 0000, and it comes and goes. She denies any pain, but feels some pressure. She states that she always feels pressure, and the pressure isn't really new it just kept her from falling asleep. She denies any VB or LOF. She had twins with her last pregnancy, and went full term.    Pelvic Pain  The patient's primary symptoms include pelvic pain. This is a new problem. The current episode started today. The problem occurs intermittently. The problem has been unchanged. The patient is experiencing no pain (pressure only. She cannot rate it. ). The problem affects both sides. She is pregnant. Pertinent negatives include no abdominal pain, chills, constipation, diarrhea, dysuria, fever, frequency, nausea, urgency or vomiting. The vaginal discharge was normal. There has been no bleeding. Nothing aggravates the symptoms. She has tried nothing for the symptoms. Sexual activity: No intercourse in the last 24 hours.     History reviewed. No pertinent past medical history.  History reviewed. No pertinent surgical history.  Family History  Problem Relation Age of Onset  . Varicose Veins Mother   . Diabetes Father     Social History  Substance Use Topics  . Smoking status: Not on file  . Smokeless tobacco: Never Used  . Alcohol use No    Allergies: No Known Allergies  Prescriptions Prior to Admission  Medication Sig Dispense Refill Last Dose  . Prenatal Vit-Fe Fumarate-FA (MULTIVITAMIN-PRENATAL) 27-0.8 MG TABS tablet Take 1 tablet by mouth daily at 12 noon.   06/25/2016 at Unknown time  . ranitidine (ZANTAC) 150 MG tablet Take 150  mg by mouth 2 (two) times daily.   Past Month at Unknown time    Review of Systems  Constitutional: Negative for chills and fever.  Gastrointestinal: Negative for abdominal pain, constipation, diarrhea, nausea and vomiting.  Genitourinary: Positive for pelvic pain. Negative for dysuria, frequency and urgency.   Physical Exam   Blood pressure 117/74, pulse 113, temperature 98.6 F (37 C), temperature source Oral, resp. rate 18, height 5\' 3"  (1.6 m), weight 241 lb (109.3 kg), SpO2 100 %.  Physical Exam  Nursing note and vitals reviewed. Constitutional: She is oriented to person, place, and time. She appears well-developed and well-nourished. No distress.  HENT:  Head: Normocephalic.  Cardiovascular: Normal rate.   Respiratory: Effort normal.  GI: Soft. There is no tenderness. There is no rebound.  Genitourinary:  Genitourinary Comments:  External: no lesion Vagina: small amount of white discharge Cervix: pink, smooth, no CMT, closed/thick  Uterus: AGA, FHT A: 150, FHT B: 160.   Neurological: She is alert and oriented to person, place, and time.  Skin: Skin is warm and dry.  Psychiatric: She has a normal mood and affect.     Results for orders placed or performed during the hospital encounter of 06/26/16 (from the past 24 hour(s))  Urinalysis, Routine w reflex microscopic     Status: Abnormal   Collection Time: 06/26/16  4:20 AM  Result Value Ref Range   Color, Urine YELLOW YELLOW   APPearance HAZY (A) CLEAR   Specific Gravity,  Urine 1.014 1.005 - 1.030   pH 7.0 5.0 - 8.0   Glucose, UA NEGATIVE NEGATIVE mg/dL   Hgb urine dipstick NEGATIVE NEGATIVE   Bilirubin Urine NEGATIVE NEGATIVE   Ketones, ur 20 (A) NEGATIVE mg/dL   Protein, ur NEGATIVE NEGATIVE mg/dL   Nitrite NEGATIVE NEGATIVE   Leukocytes, UA TRACE (A) NEGATIVE   RBC / HPF 0-5 0 - 5 RBC/hpf   WBC, UA 0-5 0 - 5 WBC/hpf   Bacteria, UA FEW (A) NONE SEEN   Squamous Epithelial / LPF 0-5 (A) NONE SEEN   Mucous  PRESENT   Wet prep, genital     Status: Abnormal   Collection Time: 06/26/16  5:11 AM  Result Value Ref Range   Yeast Wet Prep HPF POC NONE SEEN NONE SEEN   Trich, Wet Prep NONE SEEN NONE SEEN   Clue Cells Wet Prep HPF POC NONE SEEN NONE SEEN   WBC, Wet Prep HPF POC MODERATE (A) NONE SEEN   Sperm NONE SEEN    Toco: no UCs MAU Course  Procedures  MDM [id number]: D/W Dr. Mindi SlickerBanga, ok for DC home.   Assessment and Plan   1. Pain of round ligament affecting pregnancy, antepartum   2. [redacted] weeks gestation of pregnancy   3. Dichorionic diamniotic twin pregnancy in second trimester    DC home Comfort measures reviewed  2nd Trimester precautions  UC pending  PTL precautions  Fetal kick counts RX: none  Return to MAU as needed FU with OB as planned  Follow-up Information    [facility] Bang[facility], DO Follow up.   Specialty:  Obstetrics and Gynecology Contact information: [address] [place] [address] [phone]            Tawnya CrookHogan, Michaeljames Milnes Donovan 06/26/2016, 4:59 AM

## 2016-06-26 NOTE — MAU Note (Signed)
Pt reports she began having some pressure and some abd tightening off/on.denies bleeding.

## 2016-06-27 LAB — CULTURE, OB URINE

## 2016-06-30 NOTE — L&D Delivery Note (Signed)
Delivery Note   Suzanne, [name] [[id number]]  At 2:18 PM a viable female was delivered via  (Presentation: vtx; ROA ).  APGAR: 9, 9; weight  pending.    Baby B was then found to be vertex by u/s and palpation, AROM done and she pushed well.    Suzanne, Glastetter [name] [[id number]]  At 2:24 PM a viable female was delivered via Vaginal, Spontaneous Delivery (Presentation: vtx; ROA ).  APGAR: 9, 9; weight pending.   Placenta status: spontaneous, intact.  Cord:  with the following complications: none.  Anesthesia:  Epidural Episiotomy: None Lacerations: None Suture Repair: none Est. Blood Loss (mL):  500   Mom to postpartum.   Baby A to Couplet care / Skin to Skin.   Baby B to Couplet care / Skin to Skin. Discussed circumcision, will do in hospital.  Leighton Roach Dyasia Firestine 10/24/2016, 2:39 PM

## 2016-07-17 DIAGNOSIS — Z3A23 23 weeks gestation of pregnancy: Secondary | ICD-10-CM | POA: Diagnosis not present

## 2016-07-17 DIAGNOSIS — O30042 Twin pregnancy, dichorionic/diamniotic, second trimester: Secondary | ICD-10-CM | POA: Diagnosis not present

## 2016-08-13 DIAGNOSIS — Z3A27 27 weeks gestation of pregnancy: Secondary | ICD-10-CM | POA: Diagnosis not present

## 2016-08-13 DIAGNOSIS — O30042 Twin pregnancy, dichorionic/diamniotic, second trimester: Secondary | ICD-10-CM | POA: Diagnosis not present

## 2016-08-19 DIAGNOSIS — O30043 Twin pregnancy, dichorionic/diamniotic, third trimester: Secondary | ICD-10-CM | POA: Diagnosis not present

## 2016-08-19 DIAGNOSIS — Z3689 Encounter for other specified antenatal screening: Secondary | ICD-10-CM | POA: Diagnosis not present

## 2016-08-19 DIAGNOSIS — Z3A28 28 weeks gestation of pregnancy: Secondary | ICD-10-CM | POA: Diagnosis not present

## 2016-09-01 DIAGNOSIS — O30003 Twin pregnancy, unspecified number of placenta and unspecified number of amniotic sacs, third trimester: Secondary | ICD-10-CM | POA: Diagnosis not present

## 2016-09-01 DIAGNOSIS — Z3A30 30 weeks gestation of pregnancy: Secondary | ICD-10-CM | POA: Diagnosis not present

## 2016-09-03 DIAGNOSIS — Z3482 Encounter for supervision of other normal pregnancy, second trimester: Secondary | ICD-10-CM | POA: Diagnosis not present

## 2016-09-03 DIAGNOSIS — Z3483 Encounter for supervision of other normal pregnancy, third trimester: Secondary | ICD-10-CM | POA: Diagnosis not present

## 2016-09-10 DIAGNOSIS — Z23 Encounter for immunization: Secondary | ICD-10-CM | POA: Diagnosis not present

## 2016-09-10 DIAGNOSIS — O30043 Twin pregnancy, dichorionic/diamniotic, third trimester: Secondary | ICD-10-CM | POA: Diagnosis not present

## 2016-09-10 DIAGNOSIS — Z3A31 31 weeks gestation of pregnancy: Secondary | ICD-10-CM | POA: Diagnosis not present

## 2016-10-07 DIAGNOSIS — Z3685 Encounter for antenatal screening for Streptococcus B: Secondary | ICD-10-CM | POA: Diagnosis not present

## 2016-10-07 DIAGNOSIS — O30043 Twin pregnancy, dichorionic/diamniotic, third trimester: Secondary | ICD-10-CM | POA: Diagnosis not present

## 2016-10-07 DIAGNOSIS — Z3A35 35 weeks gestation of pregnancy: Secondary | ICD-10-CM | POA: Diagnosis not present

## 2016-10-08 LAB — OB RESULTS CONSOLE GBS: STREP GROUP B AG: POSITIVE

## 2016-10-17 ENCOUNTER — Telehealth (HOSPITAL_COMMUNITY): Payer: Self-pay | Admitting: *Deleted

## 2016-10-17 ENCOUNTER — Encounter (HOSPITAL_COMMUNITY): Payer: Self-pay | Admitting: *Deleted

## 2016-10-17 NOTE — Telephone Encounter (Signed)
Preadmission screen  

## 2016-10-24 ENCOUNTER — Inpatient Hospital Stay (HOSPITAL_COMMUNITY): Payer: 59 | Admitting: Anesthesiology

## 2016-10-24 ENCOUNTER — Inpatient Hospital Stay (HOSPITAL_COMMUNITY)
Admission: RE | Admit: 2016-10-24 | Discharge: 2016-10-26 | DRG: 775 | Disposition: A | Discharge status: Home / Self Care | Payer: 59 | Source: Ambulatory Visit | Attending: Obstetrics and Gynecology | Admitting: Obstetrics and Gynecology

## 2016-10-24 ENCOUNTER — Encounter (HOSPITAL_COMMUNITY): Payer: Self-pay

## 2016-10-24 ENCOUNTER — Encounter (HOSPITAL_COMMUNITY)
Admission: RE | Disposition: A | Discharge status: Home / Self Care | Payer: Self-pay | Source: Ambulatory Visit | Attending: Obstetrics and Gynecology

## 2016-10-24 DIAGNOSIS — Z833 Family history of diabetes mellitus: Secondary | ICD-10-CM

## 2016-10-24 DIAGNOSIS — Z3A37 37 weeks gestation of pregnancy: Secondary | ICD-10-CM

## 2016-10-24 DIAGNOSIS — O99824 Streptococcus B carrier state complicating childbirth: Secondary | ICD-10-CM | POA: Diagnosis present

## 2016-10-24 DIAGNOSIS — O321XX2 Maternal care for breech presentation, fetus 2: Principal | ICD-10-CM | POA: Diagnosis present

## 2016-10-24 DIAGNOSIS — Z3A00 Weeks of gestation of pregnancy not specified: Secondary | ICD-10-CM | POA: Diagnosis not present

## 2016-10-24 DIAGNOSIS — O30043 Twin pregnancy, dichorionic/diamniotic, third trimester: Secondary | ICD-10-CM | POA: Diagnosis not present

## 2016-10-24 LAB — TYPE AND SCREEN
ABO/RH(D): A POS
ANTIBODY SCREEN: NEGATIVE

## 2016-10-24 LAB — CBC
HCT: 35.7 % — ABNORMAL LOW (ref 36.0–46.0)
Hemoglobin: 12.1 g/dL (ref 12.0–15.0)
MCH: 27.9 pg (ref 26.0–34.0)
MCHC: 33.9 g/dL (ref 30.0–36.0)
MCV: 82.3 fL (ref 78.0–100.0)
PLATELETS: 227 10*3/uL (ref 150–400)
RBC: 4.34 MIL/uL (ref 3.87–5.11)
RDW: 15.3 % (ref 11.5–15.5)
WBC: 4.5 10*3/uL (ref 4.0–10.5)

## 2016-10-24 LAB — RPR: RPR Ser Ql: NONREACTIVE

## 2016-10-24 LAB — ABO/RH: ABO/RH(D): A POS

## 2016-10-24 SURGERY — Surgical Case
Anesthesia: Epidural | Wound class: Clean Contaminated

## 2016-10-24 MED ORDER — DIBUCAINE 1 % RE OINT: 1.0000 "application " | TOPICAL_OINTMENT | RECTAL | Status: DC | PRN

## 2016-10-24 MED ORDER — PHENYLEPHRINE 40 MCG/ML (10ML) SYRINGE FOR IV PUSH (FOR BLOOD PRESSURE SUPPORT)
80.0000 ug | PREFILLED_SYRINGE | INTRAVENOUS | Status: DC | PRN
  Filled 2016-10-24: qty 10

## 2016-10-24 MED ORDER — LACTATED RINGERS IV SOLN: 500.0000 mL | Freq: Once | INTRAVENOUS | Status: DC

## 2016-10-24 MED ORDER — LIDOCAINE HCL (PF) 1 % IJ SOLN: 30.0000 mL | INTRAMUSCULAR | Status: DC | PRN

## 2016-10-24 MED ORDER — SIMETHICONE 80 MG PO CHEW: 80.0000 mg | CHEWABLE_TABLET | ORAL | Status: DC | PRN

## 2016-10-24 MED ORDER — MAGNESIUM HYDROXIDE 400 MG/5ML PO SUSP: 30.0000 mL | ORAL | Status: DC | PRN

## 2016-10-24 MED ORDER — PENICILLIN G POT IN DEXTROSE 60000 UNIT/ML IV SOLN
3.0000 10*6.[IU] | INTRAVENOUS | Status: DC
  Administered 2016-10-24: 3 10*6.[IU] via INTRAVENOUS
  Filled 2016-10-24 (×4): qty 50

## 2016-10-24 MED ORDER — METHYLERGONOVINE MALEATE 0.2 MG PO TABS: 0.2000 mg | ORAL_TABLET | ORAL | Status: DC | PRN

## 2016-10-24 MED ORDER — SOD CITRATE-CITRIC ACID 500-334 MG/5ML PO SOLN
30.0000 mL | ORAL | Status: DC | PRN
  Filled 2016-10-24: qty 15

## 2016-10-24 MED ORDER — BENZOCAINE-MENTHOL 20-0.5 % EX AERO
1.0000 "application " | INHALATION_SPRAY | CUTANEOUS | Status: DC | PRN
  Administered 2016-10-25: 1 via TOPICAL
  Filled 2016-10-24: qty 56

## 2016-10-24 MED ORDER — EPHEDRINE 5 MG/ML INJ: 10.0000 mg | INTRAVENOUS | Status: DC | PRN

## 2016-10-24 MED ORDER — OXYTOCIN 40 UNITS IN LACTATED RINGERS INFUSION - SIMPLE MED
1.0000 m[IU]/min | INTRAVENOUS | Status: DC
  Administered 2016-10-24: 2 m[IU]/min via INTRAVENOUS
  Administered 2016-10-24: 4 m[IU]/min via INTRAVENOUS
  Administered 2016-10-24: 6 m[IU]/min via INTRAVENOUS
  Administered 2016-10-24: 16 m[IU]/min via INTRAVENOUS
  Administered 2016-10-24: 14 m[IU]/min via INTRAVENOUS
  Administered 2016-10-24: 8 m[IU]/min via INTRAVENOUS
  Administered 2016-10-24: 12 m[IU]/min via INTRAVENOUS
  Filled 2016-10-24: qty 1000

## 2016-10-24 MED ORDER — IBUPROFEN 600 MG PO TABS
600.0000 mg | ORAL_TABLET | Freq: Four times a day (QID) | ORAL | Status: DC
  Administered 2016-10-24 – 2016-10-26 (×7): 600 mg via ORAL
  Filled 2016-10-24 (×7): qty 1

## 2016-10-24 MED ORDER — TERBUTALINE SULFATE 1 MG/ML IJ SOLN
0.2500 mg | Freq: Once | INTRAMUSCULAR | Status: DC | PRN
Start: 2016-10-24 — End: 2016-10-24

## 2016-10-24 MED ORDER — DIPHENHYDRAMINE HCL 50 MG/ML IJ SOLN: 12.5000 mg | INTRAMUSCULAR | Status: DC | PRN

## 2016-10-24 MED ORDER — ONDANSETRON HCL 4 MG/2ML IJ SOLN: 4.0000 mg | INTRAMUSCULAR | Status: DC | PRN

## 2016-10-24 MED ORDER — MEASLES, MUMPS & RUBELLA VAC ~~LOC~~ INJ: 0.5000 mL | INJECTION | Freq: Once | SUBCUTANEOUS | Status: DC

## 2016-10-24 MED ORDER — OXYCODONE-ACETAMINOPHEN 5-325 MG PO TABS: 2.0000 | ORAL_TABLET | ORAL | Status: DC | PRN

## 2016-10-24 MED ORDER — BUTORPHANOL TARTRATE 1 MG/ML IJ SOLN: 1.0000 mg | INTRAMUSCULAR | Status: DC | PRN

## 2016-10-24 MED ORDER — DIPHENHYDRAMINE HCL 25 MG PO CAPS: 25.0000 mg | ORAL_CAPSULE | Freq: Four times a day (QID) | ORAL | Status: DC | PRN

## 2016-10-24 MED ORDER — ZOLPIDEM TARTRATE 5 MG PO TABS: 5.0000 mg | ORAL_TABLET | Freq: Every evening | ORAL | Status: DC | PRN

## 2016-10-24 MED ORDER — OXYTOCIN BOLUS FROM INFUSION
500.0000 mL | Freq: Once | INTRAVENOUS | Status: DC
  Administered 2016-10-24: 500 mL via INTRAVENOUS

## 2016-10-24 MED ORDER — LIDOCAINE HCL (PF) 1 % IJ SOLN
INTRAMUSCULAR | Status: DC | PRN
  Administered 2016-10-24: 4 mL via EPIDURAL

## 2016-10-24 MED ORDER — OXYCODONE HCL 5 MG PO TABS: 5.0000 mg | ORAL_TABLET | ORAL | Status: DC | PRN

## 2016-10-24 MED ORDER — PHENYLEPHRINE 40 MCG/ML (10ML) SYRINGE FOR IV PUSH (FOR BLOOD PRESSURE SUPPORT): 80.0000 ug | PREFILLED_SYRINGE | INTRAVENOUS | Status: DC | PRN

## 2016-10-24 MED ORDER — PRENATAL MULTIVITAMIN CH
1.0000 | ORAL_TABLET | Freq: Every day | ORAL | Status: DC
Start: 2016-10-25 — End: 2016-10-26
  Administered 2016-10-25: 1 via ORAL
  Filled 2016-10-24: qty 1

## 2016-10-24 MED ORDER — ONDANSETRON HCL 4 MG/2ML IJ SOLN: 4.0000 mg | Freq: Four times a day (QID) | INTRAMUSCULAR | Status: DC | PRN

## 2016-10-24 MED ORDER — ACETAMINOPHEN 325 MG PO TABS: 650.0000 mg | ORAL_TABLET | ORAL | Status: DC | PRN

## 2016-10-24 MED ORDER — WITCH HAZEL-GLYCERIN EX PADS: 1.0000 "application " | MEDICATED_PAD | CUTANEOUS | Status: DC | PRN

## 2016-10-24 MED ORDER — COCONUT OIL OIL: 1.0000 "application " | TOPICAL_OIL | Status: DC | PRN

## 2016-10-24 MED ORDER — LACTATED RINGERS IV SOLN
500.0000 mL | INTRAVENOUS | Status: DC | PRN
  Administered 2016-10-24: 150 mL via INTRAVENOUS

## 2016-10-24 MED ORDER — OXYCODONE HCL 5 MG PO TABS: 10.0000 mg | ORAL_TABLET | ORAL | Status: DC | PRN

## 2016-10-24 MED ORDER — PHENYLEPHRINE 40 MCG/ML (10ML) SYRINGE FOR IV PUSH (FOR BLOOD PRESSURE SUPPORT)
80.0000 ug | PREFILLED_SYRINGE | INTRAVENOUS | Status: DC | PRN
  Administered 2016-10-24: 80 ug via INTRAVENOUS

## 2016-10-24 MED ORDER — SENNOSIDES-DOCUSATE SODIUM 8.6-50 MG PO TABS
2.0000 | ORAL_TABLET | ORAL | Status: DC
Start: 2016-10-25 — End: 2016-10-26
  Administered 2016-10-24: 2 via ORAL
  Filled 2016-10-24 (×2): qty 2

## 2016-10-24 MED ORDER — OXYCODONE-ACETAMINOPHEN 5-325 MG PO TABS: 1.0000 | ORAL_TABLET | ORAL | Status: DC | PRN

## 2016-10-24 MED ORDER — ACETAMINOPHEN 325 MG PO TABS
650.0000 mg | ORAL_TABLET | ORAL | Status: DC | PRN
Start: 2016-10-24 — End: 2016-10-26

## 2016-10-24 MED ORDER — ONDANSETRON HCL 4 MG PO TABS: 4.0000 mg | ORAL_TABLET | ORAL | Status: DC | PRN

## 2016-10-24 MED ORDER — TETANUS-DIPHTH-ACELL PERTUSSIS 5-2.5-18.5 LF-MCG/0.5 IM SUSP: 0.5000 mL | Freq: Once | INTRAMUSCULAR | Status: DC

## 2016-10-24 MED ORDER — PENICILLIN G POTASSIUM 5000000 UNITS IJ SOLR
5.0000 10*6.[IU] | Freq: Once | INTRAMUSCULAR | Status: AC
  Administered 2016-10-24: 5 10*6.[IU] via INTRAVENOUS
  Filled 2016-10-24: qty 5

## 2016-10-24 MED ORDER — LACTATED RINGERS IV SOLN
INTRAVENOUS | Status: DC
  Administered 2016-10-24: 08:00:00 via INTRAVENOUS

## 2016-10-24 MED ORDER — FENTANYL 2.5 MCG/ML BUPIVACAINE 1/10 % EPIDURAL INFUSION (WH - ANES)
14.0000 mL/h | INTRAMUSCULAR | Status: DC | PRN
  Administered 2016-10-24: 14 mL/h via EPIDURAL
  Filled 2016-10-24: qty 100

## 2016-10-24 MED ORDER — OXYTOCIN 40 UNITS IN LACTATED RINGERS INFUSION - SIMPLE MED: 2.5000 [IU]/h | INTRAVENOUS | Status: DC

## 2016-10-24 MED ORDER — METHYLERGONOVINE MALEATE 0.2 MG/ML IJ SOLN: 0.2000 mg | INTRAMUSCULAR | Status: DC | PRN

## 2016-10-24 SURGICAL SUPPLY — 31 items
CHLORAPREP W/TINT 26ML (MISCELLANEOUS) IMPLANT
CLAMP CORD UMBIL (MISCELLANEOUS) ×4 IMPLANT
CLOTH BEACON ORANGE TIMEOUT ST (SAFETY) ×2 IMPLANT
CONTAINER PREFILL 10% NBF 15ML (MISCELLANEOUS) IMPLANT
DRSG OPSITE POSTOP 4X10 (GAUZE/BANDAGES/DRESSINGS) IMPLANT
ELECT REM PT RETURN 9FT ADLT (ELECTROSURGICAL)
ELECTRODE REM PT RTRN 9FT ADLT (ELECTROSURGICAL) IMPLANT
EXTRACTOR VACUUM KIWI (MISCELLANEOUS) IMPLANT
EXTRACTOR VACUUM M CUP 4 TUBE (SUCTIONS) IMPLANT
GLOVE BIOGEL PI IND STRL 7.0 (GLOVE) ×1 IMPLANT
GLOVE BIOGEL PI INDICATOR 7.0 (GLOVE) ×1
GLOVE ORTHO TXT STRL SZ7.5 (GLOVE) ×2 IMPLANT
GOWN STRL REUS W/TWL LRG LVL3 (GOWN DISPOSABLE) ×4 IMPLANT
KIT ABG SYR 3ML LUER SLIP (SYRINGE) ×4 IMPLANT
NEEDLE HYPO 25X5/8 SAFETYGLIDE (NEEDLE) ×4 IMPLANT
NS IRRIG 1000ML POUR BTL (IV SOLUTION) ×2 IMPLANT
PACK C SECTION WH (CUSTOM PROCEDURE TRAY) ×2 IMPLANT
PAD OB MATERNITY 4.3X12.25 (PERSONAL CARE ITEMS) ×2 IMPLANT
PENCIL SMOKE EVAC W/HOLSTER (ELECTROSURGICAL) IMPLANT
RTRCTR C-SECT PINK 25CM LRG (MISCELLANEOUS) IMPLANT
SUT CHROMIC 1 CTX 36 (SUTURE) IMPLANT
SUT PLAIN 0 NONE (SUTURE) IMPLANT
SUT PLAIN 2 0 XLH (SUTURE) IMPLANT
SUT VIC AB 0 CT1 27 (SUTURE)
SUT VIC AB 0 CT1 27XBRD ANBCTR (SUTURE) IMPLANT
SUT VIC AB 2-0 CT1 (SUTURE) IMPLANT
SUT VIC AB 2-0 CT1 27 (SUTURE)
SUT VIC AB 2-0 CT1 TAPERPNT 27 (SUTURE) IMPLANT
SUT VIC AB 4-0 KS 27 (SUTURE) IMPLANT
TOWEL OR 17X24 6PK STRL BLUE (TOWEL DISPOSABLE) ×4 IMPLANT
TRAY FOLEY BAG SILVER LF 14FR (SET/KITS/TRAYS/PACK) ×2 IMPLANT

## 2016-10-24 NOTE — Transfer of Care (Signed)
Immediate Anesthesia Transfer of Care Note  Patient: Suzanne [name]  Procedure(s) Performed: Procedure(s): VAGINAL DELIVERY (N/A)  Patient Location: PACU and Mother/Baby  Anesthesia Type:Epidural  Level of Consciousness: awake, alert  and oriented  Airway & Oxygen Therapy: Patient Spontanous Breathing  Post-op Assessment: Report given to RN and Post -op Vital signs reviewed and stable  Post vital signs: Reviewed and stable  Last Vitals:  Vitals:   10/24/16 1346 10/24/16 1351  BP: 122/68 116/78  Pulse: 85 94  Resp:    Temp:      Last Pain:  Vitals:   10/24/16 1254  TempSrc: Oral  PainSc:          Complications: No apparent anesthesia complications

## 2016-10-24 NOTE — Anesthesia Preprocedure Evaluation (Signed)
Anesthesia Evaluation  Patient identified by MRN, date of birth, ID band Patient awake    Reviewed: Allergy & Precautions, NPO status , Patient's Chart, lab work & pertinent test results  Airway Mallampati: III       Dental  (+) Teeth Intact   Pulmonary neg pulmonary ROS,    breath sounds clear to auscultation       Cardiovascular negative cardio ROS   Rhythm:Regular Rate:Normal     Neuro/Psych negative neurological ROS  negative psych ROS   GI/Hepatic negative GI ROS, Neg liver ROS,   Endo/Other  negative endocrine ROS  Renal/GU negative Renal ROS  negative genitourinary   Musculoskeletal negative musculoskeletal ROS (+)   Abdominal   Peds negative pediatric ROS (+)  Hematology negative hematology ROS (+)   Anesthesia Other Findings   Reproductive/Obstetrics (+) Pregnancy                             Lab Results  Component Value Date   WBC 4.5 10/24/2016   HGB 12.1 10/24/2016   HCT 35.7 (L) 10/24/2016   MCV 82.3 10/24/2016   PLT 227 10/24/2016     Anesthesia Physical Anesthesia Plan  ASA: III  Anesthesia Plan: Epidural   Post-op Pain Management:    Induction:   Airway Management Planned:   Additional Equipment:   Intra-op Plan:   Post-operative Plan:   Informed Consent: I have reviewed the patients History and Physical, chart, labs and discussed the procedure including the risks, benefits and alternatives for the proposed anesthesia with the patient or authorized representative who has indicated his/her understanding and acceptance.     Plan Discussed with:   Anesthesia Plan Comments:         Anesthesia Quick Evaluation

## 2016-10-24 NOTE — Anesthesia Pain Management Evaluation Note (Signed)
  [facility] Pain Management Visit Note  Patient: Suzanne [name], 26 y.o., female  "Hello I am a member of the anesthesia team at [facility]. We have an anesthesia team available at all times to provide care throughout the hospital, including epidural management and anesthesia for C-section. I don't know your plan for the delivery whether it a natural birth, water birth, IV sedation, nitrous supplementation, doula or epidural, but we want to meet your pain goals."   1.Was your pain managed to your expectations on prior hospitalizations?   Yes   2.What is your expectation for pain management during this hospitalization?     Epidural  3.How can we help you reach that goal? unsure  Record the patient's initial score and the patient's pain goal.   Pain: 0  Pain Goal: 7 [facility] wants you to be able to say your pain was always managed very well.  Cephus Shelling 10/24/2016

## 2016-10-24 NOTE — Progress Notes (Signed)
Patient ID: Suzanne [name], female   DOB: [birth year], 26 y.o.   MRN: [id number] Feeling some ctx Afeb, VSS FHT- Cat I x 2 VE-4/70/-1, vtx, attempted AROM-no fluid Continue pitocin and PCN, monitor progress

## 2016-10-24 NOTE — Anesthesia Postprocedure Evaluation (Signed)
Anesthesia Post Note  Patient: Suzanne [name]  Procedure(s) Performed: Procedure(s) (LRB): VAGINAL DELIVERY (N/A)  Patient location during evaluation: Mother Baby Anesthesia Type: Epidural Level of consciousness: awake and alert and oriented Pain management: pain level controlled Vital Signs Assessment: post-procedure vital signs reviewed and stable Respiratory status: nonlabored ventilation and spontaneous breathing Cardiovascular status: stable Postop Assessment: no headache, no backache, epidural receding, patient able to bend at knees, no signs of nausea or vomiting and adequate PO intake Anesthetic complications: no        Last Vitals:  Vitals:   10/24/16 1745 10/24/16 2047  BP: 104/65 (!) 100/56  Pulse: 96 63  Resp: 18 20  Temp: 37.5 C 37.1 C    Last Pain:  Vitals:   10/24/16 2047  TempSrc:   PainSc: 2    Pain Goal:                 Madison Hickman

## 2016-10-24 NOTE — Anesthesia Procedure Notes (Signed)
Epidural Patient location during procedure: OB Start time: 10/24/2016 1:08 PM End time: 10/24/2016 1:14 PM  Staffing Anesthesiologist: Shona Simpson D Performed: anesthesiologist   Preanesthetic Checklist Completed: patient identified, site marked, surgical consent, pre-op evaluation, timeout performed, IV checked, risks and benefits discussed and monitors and equipment checked  Epidural Patient position: sitting Prep: ChloraPrep Patient monitoring: heart rate, continuous pulse ox and blood pressure Approach: midline Location: L3-L4 Injection technique: LOR saline  Needle:  Needle type: Tuohy  Needle gauge: 17 G Needle length: 9 cm Catheter type: closed end flexible Catheter size: 20 Guage Test dose: negative and 1.5% lidocaine  Assessment Events: blood not aspirated, injection not painful, no injection resistance and no paresthesia  Additional Notes LOR @ 5.5  Patient identified. Risks/Benefits/Options discussed with patient including but not limited to bleeding, infection, nerve damage, paralysis, failed block, incomplete pain control, headache, blood pressure changes, nausea, vomiting, reactions to medications, itching and postpartum back pain. Confirmed with bedside nurse the patient's most recent platelet count. Confirmed with patient that they are not currently taking any anticoagulation, have any bleeding history or any family history of bleeding disorders. Patient expressed understanding and wished to proceed. All questions were answered. Sterile technique was used throughout the entire procedure. Please see nursing notes for vital signs. Test dose was given through epidural catheter and negative prior to continuing to dose epidural or start infusion. Warning signs of high block given to the patient including shortness of breath, tingling/numbness in hands, complete motor block, or any concerning symptoms with instructions to call for help. Patient was given instructions on  fall risk and not to get out of bed. All questions and concerns addressed with instructions to call with any issues or inadequate analgesia.    Reason for block:procedure for pain

## 2016-10-24 NOTE — Lactation Note (Signed)
This note was copied from a baby's chart. Lactation Consultation Note  Patient Name: Suzanne [name]'J Date: 10/24/2016 Reason for consult: Follow-up assessment   Follow up with infant after BF. Infant came off breast independently. Spoon fed 3 cc colostrum. Infant noted to be jittery when under warmer. Mikey Bussing, RN in notified via phone and to order cbg.    Maternal Data Formula Feeding for Exclusion: No Has patient been taught Hand Expression?: Yes Does the patient have breastfeeding experience prior to this delivery?: Yes  Feeding Feeding Type: Breast Fed Length of feed: 20 min (still feeding when LC left room)  LATCH Score/Interventions Latch: Grasps breast easily, tongue down, lips flanged, rhythmical sucking.  Audible Swallowing: Spontaneous and intermittent  Type of Nipple: Everted at rest and after stimulation  Comfort (Breast/Nipple): Soft / non-tender     Hold (Positioning): Assistance needed to correctly position infant at breast and maintain latch.  LATCH Score: 9  Lactation Tools Discussed/Used WIC Program: No   Consult Status Consult Status: Follow-up Date: 10/18/16 Follow-up type: In-patient    Silas Flood Cecillia Menees 10/24/2016, 4:31 PM

## 2016-10-24 NOTE — H&P (Signed)
Suzanne [name] is a 26 y.o. female, G2 P1002, EGA 37+ weeks with EDC 5-13 with di/di twins presenting for elective induction.  This is her second set of di/di twins, prenatal care uncomplicated, normal/concordant growth.  OB History    Gravida Para Term Preterm AB Living   SAB TAB Ectopic Multiple Live Births         1 2     Past Medical History:  Diagnosis Date  . Medical history non-contributory    Past Surgical History:  Procedure Laterality Date  . NO PAST SURGERIES     Family History: family history includes Diabetes in her father; Varicose Veins in her mother. Social History:  reports that she has never smoked. She has never used smokeless tobacco. She reports that she does not drink alcohol or use drugs.     Maternal Diabetes: No Genetic Screening: Normal Maternal Ultrasounds/Referrals: Normal Fetal Ultrasounds or other Referrals:  None Maternal Substance Abuse:  No Significant Maternal Medications:  None Significant Maternal Lab Results:  Lab values include: Group B Strep positive Other Comments:  No elevated risk Trisomy 21 only  Review of Systems  Respiratory: Negative.   Cardiovascular: Negative.    Maternal Medical History:  Fetal activity: Perceived fetal activity is normal.    Prenatal complications: no prenatal complications Prenatal Complications - Diabetes: none.    Dilation: 3 Effacement (%): 50 Station: -2 Exam by:: Dr. Jackelyn Knife  Height  (1.575 m), weight 109.8 kg (242 lb). Maternal Exam:  Uterine Assessment: Contraction strength is mild.  Contraction frequency is irregular.   Abdomen: Fetal presentation: vertex  Introitus: Normal vulva. Normal vagina.  Amniotic fluid character: not assessed.  Pelvis: adequate for delivery.   Cervix: Cervix evaluated by digital exam.     Fetal Exam Fetal Monitor Review: Mode: ultrasound.   Baseline rate: 140 x 2.  Variability: moderate (6-25 bpm).   Pattern: accelerations present and  no decelerations.    Fetal State Assessment: Category I - tracings are normal.     Physical Exam  Vitals reviewed. Constitutional: She appears well-developed and well-nourished.  Cardiovascular: Normal rate and regular rhythm.   Respiratory: Effort normal. No respiratory distress.  GI: Soft.    Prenatal labs: ABO, Rh: A/Positive/-- (10/12 0000) Antibody: Negative (10/12 0000) Rubella: Immune (10/12 0000) RPR: Nonreactive (10/12 0000)  HBsAg: Negative (10/12 0000)  HIV: Non-reactive (10/12 0000)  GBS: Positive (04/11 0000)   Assessment/Plan: Di/Di twin pregnancy at 37+ weeks, vtx/breech, +GBS for induction.  Ultrasound confirms vtx/breech.  We have discussed delivery options, will induce with pitocin, PCN for +GBS, discussed possible need for breech extraction.   Suzanne [name] 10/24/2016, 8:39 AM

## 2016-10-24 NOTE — Lactation Note (Signed)
This note was copied from a baby's chart. Lactation Consultation Note  Patient Name: Boy Quaneisha Varnadore ZJerelene SalaamDate: 10/24/2016 Reason for consult: Initial assessment;Multiple gestation;Other (Comment) (Early Term)   Initial consult with Exp BF mom of Early Term twins, weights pending.   Twin A Elijah was latched and nursing actively on left breast in the football hold. Infant with flanged lips, rhythmic suckles and intermittent swallows. Infant fed for 30 minutes and then came off. After a few minutes he began cueing to feed. Hand expressed mom's left breast and LC and FOB spoon fed infant colostrum. He tolerated it well.   Twin B Jackelyn Hoehn was being held by FOB cueing to feed. We latched him to the right breast in the football hold. Infant latched easily with flanged lips, rhythmic suckles and intermittent swallows. Infant was still feeding when LC left room;   Enc mom to feed infant at least every 3 at first feeding cues and to offer supplement of colostrum with spoon after BF. Enc mom to decrease stim in room, keep infant hats on and perform STS as much as possible. Enc mom to call out for feeding assistance as needed.   Mom has a Medela PIS at home for use. BF Resources Handout and LC Brochure given, mom informed of IP/OP Services, BF Support Groups and LC phone #.   Enc mom to switch breasts each time between infants and to awaken to feed as needed at least every 3 hours. Enc mom to awaken twin when done nursing 1st twin even if not cueing to feed.    Maternal Data Formula Feeding for Exclusion: No Has patient been taught Hand Expression?: Yes Does the patient have breastfeeding experience prior to this delivery?: Yes  Feeding Feeding Type: Breast Fed Length of feed: 35 min  LATCH Score/Interventions Latch: Grasps breast easily, tongue down, lips flanged, rhythmical sucking.  Audible Swallowing: Spontaneous and intermittent  Type of Nipple: Everted at rest and after  stimulation  Comfort (Breast/Nipple): Soft / non-tender     Hold (Positioning): Assistance needed to correctly position infant at breast and maintain latch. Intervention(s): Breastfeeding basics reviewed;Support Pillows;Position options;Skin to skin  LATCH Score: 9  Lactation Tools Discussed/Used WIC Program: No   Consult Status Consult Status: Follow-up Date: 10/25/16 Follow-up type: In-patient    Silas Flood Akera Snowberger 10/24/2016, 4:05 PM

## 2016-10-25 LAB — CBC
HCT: 33 % — ABNORMAL LOW (ref 36.0–46.0)
HEMOGLOBIN: 11 g/dL — AB (ref 12.0–15.0)
MCH: 27.6 pg (ref 26.0–34.0)
MCHC: 33.3 g/dL (ref 30.0–36.0)
MCV: 82.7 fL (ref 78.0–100.0)
PLATELETS: 217 10*3/uL (ref 150–400)
RBC: 3.99 MIL/uL (ref 3.87–5.11)
RDW: 15.3 % (ref 11.5–15.5)
WBC: 7.7 10*3/uL (ref 4.0–10.5)

## 2016-10-25 NOTE — Progress Notes (Signed)
Mother has requested formula to supplement babies with breastfeeding. She wants to pump to stimulate milk production and provide supplement. Twin Boys have fed fair to poor at the breast. Due to weight last night being 6 lbs for each baby, Neosure 22 cal was given to parents for supplemental feedings. Mother states she planned to breast and formula after discharge.

## 2016-10-25 NOTE — Progress Notes (Signed)
Patient ID: Suzanne [name], female   DOB: 1990-12-28, 28 y.o.   MRN: [id number] PPD #1 No problems Afeb, VSS Fundus firm, NT at U-1 Continue routine postpartum care

## 2016-10-25 NOTE — Lactation Note (Signed)
This note was copied from a baby's chart. Lactation Consultation Note: Mother reports that she is going to pump and bottle feed the twins. Mother was sat up with a DEBP and instructions to pump every 2-3 hours for 15-20 mins. insntuction given in cleaning pump parts and referred to page 61 in mother /baby book for storage guidelines.Mother was advised to page for lactation consultant or staff nurse when ready to pump. Mother has room full of company. Mother has a Medela pump at home.  Mother was advised to pump 8-12 times in 24 hours.   Patient Name: Suzanne [name] WUJWJ'X Date: 10/25/2016 Reason for consult: Follow-up assessment   Maternal Data    Feeding Feeding Type: Formula Length of feed: 20 min  LATCH Score/Interventions                      Lactation Tools Discussed/Used     Consult Status      [name] 10/25/2016, 3:19 PM

## 2016-10-26 ENCOUNTER — Ambulatory Visit: Payer: Self-pay

## 2016-10-26 MED ORDER — IBUPROFEN 600 MG PO TABS: 600.0000 mg | ORAL_TABLET | Freq: Four times a day (QID) | ORAL | 0 refills | Status: DC

## 2016-10-26 NOTE — Progress Notes (Signed)
Patient ID: Suzanne [name], female   DOB: April 26, 1991, 26 y.o.   MRN: [id number] PPD #2 Doing well Afeb, VSS Fundus firm D/c home

## 2016-10-26 NOTE — Lactation Note (Signed)
This note was copied from a baby's chart. Lactation Consultation Note: Mother continuing to pump every 2-3 hours. Mother denies having any questions or concerns. She is aware that she can phone [facility] office with questions or concerns.  Patient Name: Boy Ketzaly Cardella WJXBJ'Y Date: 10/26/2016     Maternal Data    Feeding Feeding Type: Formula Nipple Type: Slow - flow  LATCH Score/Interventions                      Lactation Tools Discussed/Used     Consult Status      [name] 10/26/2016, 11:31 AM

## 2016-10-26 NOTE — Discharge Summary (Signed)
OB Discharge Summary     Patient Name: Suzanne [name] DOB: [birth year] MRN: [id number]  Date of admission: 10/24/2016 Delivering MD:    Raegen, Tarpley [[id number]]  Estell Dillinger   Shabre, Kreher [[id number]]  Earlee Herald   Date of discharge: 10/26/2016  Admitting diagnosis: INDUCTION double set up for twin vaginal delivery Intrauterine pregnancy: [redacted]w[redacted]d     Secondary diagnosis:  Active Problems:   Dichorionic diamniotic twin pregnancy in third trimester   SVD (spontaneous vaginal delivery)      Discharge diagnosis: Term Pregnancy Delivered and di/di twins                                   Hospital course:  Induction of Labor With Vaginal Delivery   26 y.o. yo [id number] at [redacted]w[redacted]d was admitted to the hospital 10/24/2016 for induction of labor.  Indication for induction: di/di twins.  Patient had an uncomplicated labor course as follows: Membrane Rupture Time/Date:    Arabell, Neria [[id number]]  12:07 PM   Meital, Riehl [place] [[id number]]  2:22 PM ,   Terriyah, Westra [[id number]]  10/24/2016   Steffany, Schoenfelder [[id number]]  10/24/2016   Intrapartum Procedures: Episiotomy:    Chaia, Ikard [[id number]]  None [1]   Mekia, Dipinto [[id number]]  None [1]                                         Lacerations:     Milca, Sytsma [[id number]]  None [1]   Elisea, Khader [[id number]]  None [1]  Patient had delivery of Viable infants.  Information for the patient's newborn:  Fedora, Knisely [[id number]]  Delivery Method: Vaginal, Spontaneous Delivery (Filed from Delivery Summary) Information for the patient's newborn:  Narya, Beavin [[id number]]  Delivery Method: Vaginal, Spontaneous Delivery (Filed from Delivery Summary)     Toya, Palacios [[id number]]  10/24/2016   Euva, Rundell [[id number]]  10/24/2016  Details of delivery can be found in separate delivery note.  Patient had a routine postpartum course. Patient is discharged home 10/26/16.  Physical exam   Vitals:   10/24/16 1745 10/24/16 2047 10/25/16 0624 10/25/16 1851  BP: 104/65 (!) 100/56 100/66 115/65  Pulse: 96 63 77 88  Resp: Temp: 99.5 F (37.5 C) 98.7 F (37.1 C) 98.3 F (36.8 C) 98.1 F (36.7 C)  TempSrc: Oral   Oral  SpO2: 100%   100%  Weight:      Height:       General: alert Lochia: appropriate Uterine Fundus: firm  Labs: Lab Results  Component Value Date   WBC 7.7 10/25/2016   HGB 11.0 (L) 10/25/2016   HCT 33.0 (L) 10/25/2016   MCV 82.7 10/25/2016   PLT 217 10/25/2016   No flowsheet data found.  Discharge instruction: per After Visit Summary and "Baby and Me Booklet".  After visit meds:  Allergies as of 10/26/2016   No Known Allergies     Medication List    STOP taking these medications   ranitidine 150 MG tablet Commonly known as:  ZANTAC     TAKE these medications   ibuprofen 600 MG tablet Commonly known as:  ADVIL,MOTRIN Take 1 tablet (600 mg total) by mouth every 6 (six) hours.   multivitamin-prenatal  27-0.8 MG Tabs tablet Take 1 tablet by mouth daily at 12 noon.       Diet: routine diet  Activity: Advance as tolerated. Pelvic rest for 6 weeks.   Outpatient follow up:3 weeks   Newborn Data:   Arnesia, Vincelette [[id number]]  Live born female  Birth Weight: 6 lb 1.2 oz (2756 g) APGAR: 9, 9   Khloi, Rawl [[id number]]  Live born female  Birth Weight: 6 lb 0.7 oz (2741 g) APGAR: 9, 9  Baby Feeding: Bottle and Breast Disposition:home with mother   10/26/2016 Zenaida Niece, MD

## 2016-10-26 NOTE — Discharge Instructions (Signed)
As per discharge pamphlet °

## 2016-11-28 NOTE — Anesthesia Postprocedure Evaluation (Signed)
Anesthesia Post Note  Patient: Suzanne [name]  Procedure(s) Performed: Procedure(s) (LRB): VAGINAL DELIVERY (N/A)     Anesthesia Post Evaluation  Last Vitals:  Vitals:   10/25/16 0624 10/25/16 1851  BP: 100/66 115/65  Pulse: 77 88  Resp: 20 16  Temp: 36.8 C 36.7 C    Last [name]:  Vitals:   10/26/16 0840  TempSrc:   PainSc: 0-No [name]                 Shelton SilvasKevin D Derik Fults

## 2016-11-28 NOTE — Addendum Note (Signed)
Addendum  created 11/28/16 1336 by Debbra Digiulio D, MD   Sign clinical note    

## 2017-02-17 ENCOUNTER — Encounter (HOSPITAL_COMMUNITY): Payer: Self-pay

## 2017-02-24 ENCOUNTER — Encounter: Payer: 59 | Admitting: Obstetrics and Gynecology

## 2017-08-28 ENCOUNTER — Ambulatory Visit (INDEPENDENT_AMBULATORY_CARE_PROVIDER_SITE_OTHER): Payer: 59 | Admitting: Family Medicine

## 2017-08-28 ENCOUNTER — Encounter: Payer: Self-pay | Admitting: Family Medicine

## 2017-08-28 ENCOUNTER — Other Ambulatory Visit: Payer: Self-pay

## 2017-08-28 DIAGNOSIS — Z6841 Body Mass Index (BMI) 40.0 and over, adult: Secondary | ICD-10-CM

## 2017-08-28 NOTE — Patient Instructions (Addendum)
1. Discussed going to the gym after you drop off kids at preschool.     IF you received an x-ray today, you will receive an invoice from [facility]. Please contact [facility] at [phone] with questions or concerns regarding your invoice.   IF you received labwork today, you will receive an invoice from [facility]. Please contact [facility] at [phone] with questions or concerns regarding your invoice.   Our billing staff will not be able to assist you with questions regarding bills from these companies.  You will be contacted with the lab results as soon as they are available. The fastest way to get your results is to activate your My Chart account. Instructions are located on the last page of this paperwork. If you have not heard from us regarding the results in 2 weeks, please contact this office.    '

## 2017-08-28 NOTE — Progress Notes (Addendum)
   3/1/201910:10 AM  Suzanne DuncansLynna N Hickling 1990-09-03, 27 y.o. female [id number]  Chief Complaint  Patient presents with  . Establish Care    HPI:   Patient is a 27 y.o. female with past medical history significant for obesity who presents today to establish care  Main concern is her weight Has has 2 set of twins, almost 5 and almost 29yo Husband had a vasectomy She has a gym membership Used to be more active before last pregnancy, used to go to the gym on her days off and she was always walking in her job. 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 Medical history non-contributory     Past Surgical History:  Procedure Laterality Date  . NO PAST SURGERIES    . VAGINAL DELIVERY N/A 10/24/2016   Procedure: VAGINAL DELIVERY;  Surgeon: Lavina Hammanodd Meisinger, MD;  [facility];  Service: Obstetrics;  Laterality: N/A;    Social History   Tobacco Use  . Smoking status: Never Smoker  . Smokeless tobacco: Never Used  Substance Use Topics  . Alcohol use: No    Family History  Problem Relation Age of Onset  . Varicose Veins Mother   . Diabetes Father     Review of Systems  Constitutional: Negative for chills and fever.  Respiratory: Negative for cough and shortness of breath.   Cardiovascular: Negative for chest pain, palpitations and leg swelling.  Gastrointestinal: Negative for abdominal pain, nausea and vomiting.     OBJECTIVE:  Blood pressure 124/76, pulse 80, temperature 99 F (37.2 C), temperature source Oral, resp. rate 16, height 5\' 2"  (1.575  m), weight 256 lb (116.1 kg), last menstrual period 08/19/2017, SpO2 98 %, not currently breastfeeding.  Physical Exam  Constitutional: She is oriented to person, place, and time and well-developed, well-nourished, and in no distress.  HENT:  Head: Normocephalic and atraumatic.  Mouth/Throat: Oropharynx is clear and moist. No oropharyngeal exudate.  Eyes: EOM are normal. Pupils are equal, round, and reactive to light. No scleral icterus.  Neck: Neck supple.  Cardiovascular: Normal rate, regular rhythm and normal heart sounds. Exam reveals no gallop and no friction rub.  No murmur heard. Pulmonary/Chest: Effort normal and breath sounds normal. She has no wheezes. She has no rales.  Musculoskeletal: She exhibits no edema.  Neurological: She is alert and oriented to person, place, and time. Gait normal.  Skin: Skin is warm and dry.    ASSESSMENT and PLAN  1. Obesity, morbid 2. BMI 45.0-49.9, adult (HCC) Used MI techniques to discuss weight loss strategies, patient identified goal of going to the gym after dropping off older twins at preschool. She feels very confident in being able to implement this change. Feels she will be supported by family.   Return in about 6 weeks (around 10/09/2017).    Myles LippsIrma M Santiago, MD [facility] [address] [place], [place] [address] Ph.  [phone] Fax [phone]

## 2017-10-09 ENCOUNTER — Ambulatory Visit: Payer: 59 | Admitting: Family Medicine

## 2018-07-23 ENCOUNTER — Ambulatory Visit: Payer: Self-pay | Admitting: Physician Assistant

## 2018-07-23 ENCOUNTER — Encounter: Payer: Self-pay | Admitting: Physician Assistant

## 2018-07-23 VITALS — BP 120/86 | HR 111 | Temp 98.3°F | Resp 20 | Ht 63.0 in | Wt 259.0 lb

## 2018-07-23 DIAGNOSIS — R059 Cough, unspecified: Secondary | ICD-10-CM

## 2018-07-23 DIAGNOSIS — R0981 Nasal congestion: Secondary | ICD-10-CM

## 2018-07-23 DIAGNOSIS — E669 Obesity, unspecified: Secondary | ICD-10-CM | POA: Insufficient documentation

## 2018-07-23 DIAGNOSIS — R0982 Postnasal drip: Secondary | ICD-10-CM

## 2018-07-23 DIAGNOSIS — R05 Cough: Secondary | ICD-10-CM

## 2018-07-23 MED ORDER — LORATADINE 10 MG PO TABS: 10.0000 mg | ORAL_TABLET | Freq: Every day | ORAL | 0 refills | Status: DC

## 2018-07-23 MED ORDER — AMOXICILLIN-POT CLAVULANATE 875-125 MG PO TABS: 1.0000 | ORAL_TABLET | Freq: Two times a day (BID) | ORAL | 0 refills | Status: AC

## 2018-07-23 MED ORDER — BENZONATATE 200 MG PO CAPS: 200.0000 mg | ORAL_CAPSULE | Freq: Two times a day (BID) | ORAL | 0 refills | Status: DC | PRN

## 2018-07-23 MED ORDER — FLUTICASONE PROPIONATE 50 MCG/ACT NA SUSP: 2.0000 | Freq: Every day | NASAL | 0 refills | Status: DC

## 2018-07-23 NOTE — Progress Notes (Addendum)
Patient ID: Suzanne [name] DOB: 1990-07-29 AGE: 28 y.o. MRN: [id number]   PCP: Patient, No Pcp Per   Chief Complaint:  Chief Complaint  Patient presents with  . Nasal Congestion    3d  . Cough    3d  . Chest congestion    3d     Subjective:    HPI:  Suzanne [name] is a 28 y.o. female presents for evaluation  Chief Complaint  Patient presents with  . Nasal Congestion    3d  . Cough    3d  . Chest congestion    17d    28 year old female presents to [facility] for 3-4 week history of URI symptoms. Began 06/30/2018. Began with flu-like symptoms; fever, sweats, chills, headache, nasal congestion, sore throat, cough. Symptoms improved after 4-5 days, with exception to persistent dry cough. Nagging. Annoying. Three days ago had return of URI symptoms. Initially PND. Also nasal congestion, bilateral ear fullness/pressure/popping, and chest congestion/heaviness/pressure. Has taken over the counter Mucinex with no symptom improvement. Denies return or current fever, chills, dizziness/lightheadedness, ear pain, sinus pain, sore throat, chest pain, SOB, wheezing. No asthma history. No smoking history. No previous history of DVT/PE.   Patient regularly followed by Dr. Koren Shiver MD with Primary Care at [facility]. No current medical conditions other than obesity (working on decreasing weight). Not on any medications regularly.  A limited review of symptoms was performed, pertinent positives and negatives as mentioned in HPI.  The following portions of the patient's history were reviewed and updated as appropriate: allergies, current medications and past medical history.  Patient Active Problem List   Diagnosis Date Noted  . Obesity 07/23/2018  . Dichorionic diamniotic twin pregnancy in third trimester 10/24/2016  . SVD (spontaneous vaginal delivery) 10/24/2016  . Twin pregnancy 01/13/2013    No Known Allergies  No current outpatient medications on file prior to visit.    No current facility-administered medications on file prior to visit.        Objective:   Vitals:   07/23/18 1517  BP: 120/86  Pulse: (!) 111  Resp: 20  Temp: 98.3 F (36.8 C)  SpO2: 100%     Wt Readings from Last 3 Encounters:  07/23/18 259 lb (117.5 kg)  08/28/17 256 lb (116.1 kg)  10/24/16 242 lb (109.8 kg)    Physical Exam:   General Appearance:  Patient sitting comfortably on examination table. Conversational. Peri Jefferson self-historian. In no acute distress. Afebrile.   Head:  Normocephalic, without obvious abnormality, atraumatic  Eyes:  PERRL, conjunctiva/corneas clear, EOM's intact  Ears:  Bilateral ear canals WNL. No erythema or edema. No discharge/drainage. Bilateral TMs WNL. No erythema, injection, or serous effusion. No scar tissue.  Nose: Nares normal, septum midline. Nasal mucosa with bilateral edema, just short of mucosal touching. Scant clear rhinorrhea. Nasally sounding voice. No sinus tenderness with percussion/palpation.  Throat: Lips, mucosa, and tongue normal; teeth and gums normal. Throat reveals no erythema. Tonsils with no enlargement or exudate.  Neck: Supple, symmetrical, trachea midline, no adenopathy  Lungs:   Clear to auscultation bilaterally, respirations unlabored. Good aeration. No wheezing, rales, rhonchi, or crackles. No cough elicited with deep inspiration. No wheezing with forced expiration.  Heart:  Tachycardia. No irregular rhythm. No audible murmur.  Extremities: Extremities normal, atraumatic, no cyanosis or edema  Pulses: 2+ and symmetric  Skin: Skin color, texture, turgor normal, no rashes or lesions  Lymph nodes: Cervical, supraclavicular, and axillary nodes normal  Neurologic: Normal  Assessment & Plan:    Exam findings, diagnosis etiology and medication use and indications reviewed with patient. Follow-Up and discharge instructions provided. No emergent/urgent issues found on exam.  Patient education was provided.   Patient  verbalized understanding of information provided and agrees with plan of care (POC), all questions answered. The patient is advised to call or return to clinic if condition does not see an improvement in symptoms, or to seek the care of the closest emergency department if condition worsens with the below plan.    1. Nasal congestion - amoxicillin-clavulanate (AUGMENTIN) 875-125 MG tablet; Take 1 tablet by mouth 2 (two) times daily for 7 days.  Dispense: 14 tablet; Refill: 0 - fluticasone (FLONASE) 50 MCG/ACT nasal spray; Place 2 sprays into both nostrils daily.  Dispense: 16 g; Refill: 0 - loratadine (CLARITIN) 10 MG tablet; Take 1 tablet (10 mg total) by mouth daily.  Dispense: 14 tablet; Refill: 0  2. PND (post-nasal drip)  3. Cough - benzonatate (TESSALON) 200 MG capsule; Take 1 capsule (200 mg total) by mouth 2 (two) times daily as needed for cough.  Dispense: 20 capsule; Refill: 0   Patient with 3-4 week of cough. Initially flu-like symptoms/URI symptoms, improved with exception to cough. Then second/double illness with 3 days of URI symptoms. VSS (other than mild tachycardia, no recent decongestant, no cardiac history), afebrile, in no acute distress, clear lung sounds. At this time, will treat with antibiotic, Augmentin for sinusitis coverage. Prescribed Flonase and Claritin for allergy component. Advised patient follow-up with PCP or urgent care in one week if symptoms not improving, sooner with any worsening symptoms.   [name], MHS, PA-C [name], MHS, PA-C Advanced Practice Provider [facility]  [address] [facility], [address] [place], [place] [address] (p):  [phone] [name]

## 2018-07-23 NOTE — Patient Instructions (Signed)
Thank you for choosing [facility] for your health care needs.  You have been diagnosed with rebound URI (upper respiratory infection - cold).  Increase fluids. Rest. Take over the counter Delsym for cough.  Meds ordered this encounter  Medications  . amoxicillin-clavulanate (AUGMENTIN) 875-125 MG tablet    Sig: Take 1 tablet by mouth 2 (two) times daily for 7 days.    Dispense:  14 tablet    Refill:  0    Order Specific Question:   Supervising Provider    Answer:   MILLER, BRIAN [[id number]]  . fluticasone (FLONASE) 50 MCG/ACT nasal spray    Sig: Place 2 sprays into both nostrils daily.    Dispense:  16 g    Refill:  0    Order Specific Question:   Supervising Provider    Answer:   MILLER, BRIAN [[id number]]  . loratadine (CLARITIN) 10 MG tablet    Sig: Take 1 tablet (10 mg total) by mouth daily.    Dispense:  14 tablet    Refill:  0    Order Specific Question:   Supervising Provider    Answer:   MILLER, BRIAN [[id number]]  . benzonatate (TESSALON) 200 MG capsule    Sig: Take 1 capsule (200 mg total) by mouth 2 (two) times daily as needed for cough.    Dispense:  20 capsule    Refill:  0    Order Specific Question:   Supervising Provider    Answer:   Eber Hong [[id number]]   Follow-up with family physician or urgent care or ED if your heart rate does not normalize; under 100bpm.  Follow-up in one week with family physician, urgent care, or [facility] in one week if symptoms not improving.  Cough, Adult  A cough helps to clear your throat and lungs. A cough may last only 2-3 weeks (acute), or it may last longer than 8 weeks (chronic). Many different things can cause a cough. A cough may be a sign of an illness or another medical condition. Follow these instructions at home:  Pay attention to any changes in your cough.  Take medicines only as told by your doctor. ? If you were prescribed an antibiotic medicine, take it as told by your doctor. Do not stop taking it even if you start to feel  better. ? Talk with your doctor before you try using a cough medicine.  Drink enough fluid to keep your pee (urine) clear or pale yellow.  If the air is dry, use a cold steam vaporizer or humidifier in your home.  Stay away from things that make you cough at work or at home.  If your cough is worse at night, try using extra pillows to raise your head up higher while you sleep.  Do not smoke, and try not to be around smoke. If you need help quitting, ask your doctor.  Do not have caffeine.  Do not drink alcohol.  Rest as needed. Contact a doctor if:  You have new problems (symptoms).  You cough up yellow fluid (pus).  Your cough does not get better after 2-3 weeks, or your cough gets worse.  Medicine does not help your cough and you are not sleeping well.  You have pain that gets worse or pain that is not helped with medicine.  You have a fever.  You are losing weight and you do not know why.  You have night sweats. Get help right away if:  You cough up blood.  You  have trouble breathing.  Your heartbeat is very fast. This information is not intended to replace advice given to you by your health care provider. Make sure you discuss any questions you have with your health care provider. Document Released: 02/27/2011 Document Revised: 11/22/2015 Document Reviewed: 08/23/2014 Elsevier Interactive Patient Education  2019 [employer].

## 2018-07-30 ENCOUNTER — Encounter: Payer: Self-pay | Admitting: Family Medicine

## 2018-08-03 ENCOUNTER — Encounter: Payer: Self-pay | Admitting: Family Medicine

## 2018-08-03 ENCOUNTER — Ambulatory Visit (INDEPENDENT_AMBULATORY_CARE_PROVIDER_SITE_OTHER): Payer: 59 | Admitting: Family Medicine

## 2018-08-03 VITALS — BP 126/85 | HR 84 | Resp 17 | Ht 63.0 in | Wt 258.6 lb

## 2018-08-03 DIAGNOSIS — Z1389 Encounter for screening for other disorder: Secondary | ICD-10-CM | POA: Diagnosis not present

## 2018-08-03 DIAGNOSIS — Z0001 Encounter for general adult medical examination with abnormal findings: Secondary | ICD-10-CM

## 2018-08-03 DIAGNOSIS — Z6841 Body Mass Index (BMI) 40.0 and over, adult: Secondary | ICD-10-CM | POA: Diagnosis not present

## 2018-08-03 DIAGNOSIS — Z1329 Encounter for screening for other suspected endocrine disorder: Secondary | ICD-10-CM | POA: Diagnosis not present

## 2018-08-03 DIAGNOSIS — Z130 Encounter for screening for diseases of the blood and blood-forming organs and certain disorders involving the immune mechanism: Secondary | ICD-10-CM | POA: Diagnosis not present

## 2018-08-03 DIAGNOSIS — E669 Obesity, unspecified: Secondary | ICD-10-CM

## 2018-08-03 DIAGNOSIS — Z7689 Persons encountering health services in other specified circumstances: Secondary | ICD-10-CM

## 2018-08-03 DIAGNOSIS — Z3202 Encounter for pregnancy test, result negative: Secondary | ICD-10-CM

## 2018-08-03 DIAGNOSIS — Z0000 Encounter for general adult medical examination without abnormal findings: Secondary | ICD-10-CM

## 2018-08-03 DIAGNOSIS — Z13220 Encounter for screening for lipoid disorders: Secondary | ICD-10-CM

## 2018-08-03 DIAGNOSIS — Z131 Encounter for screening for diabetes mellitus: Secondary | ICD-10-CM | POA: Diagnosis not present

## 2018-08-03 LAB — POCT URINALYSIS DIP (CLINITEK)
BILIRUBIN UA: NEGATIVE
Blood, UA: NEGATIVE
Glucose, UA: NEGATIVE mg/dL
Ketones, POC UA: NEGATIVE mg/dL
Nitrite, UA: NEGATIVE
POC PROTEIN,UA: NEGATIVE
Spec Grav, UA: >=1.03 — AB (ref 1.010–1.025)
Urobilinogen, UA: 0.2 E.U./dL
pH, UA: 7 (ref 5.0–8.0)

## 2018-08-03 LAB — POCT URINE PREGNANCY: Preg Test, Ur: NEGATIVE

## 2018-08-03 NOTE — Patient Instructions (Addendum)
Thank you for choosing Primary Care at [facility] to be your medical home!    Suzanne [name] was seen by Molli Barrows, FNP today.   Suzanne [name]'s primary care provider is Scot Jun, FNP.   For the best care possible, you should try to see Molli Barrows, FNP-C whenever you come to the clinic.   We look forward to seeing you again soon!  If you have any questions about your visit today, please call us at [phone] or feel free to reach your primary care provider via [place].   Keeping You Healthy   How to Increase Your Level of Physical Activity  Getting regular physical activity is important for your overall health and well-being. Most people do not get enough exercise. There are easy ways to increase your level of physical activity, even if you have not been very active in the past or you are just starting out. Why is physical activity important? Physical activity has many short-term and long-term health benefits. Regular exercise can:  Help you lose weight or maintain a healthy weight.  Strengthen your muscles and bones.  Boost your mood and improve self-esteem.  Reduce your risk of certain long-term (chronic) diseases, like heart disease, cancer, and diabetes.  Help you stay capable of walking and moving around (mobile) as you age.  Prevent accidents, such as falls, as you age.  Increase life expectancy. What are the benefits of being physically active on a regular basis? In addition to improving your physical health, being physically active on most days of the week can help you in ways that you may not expect. Benefits of regular physical activity may include:  Feeling good about your body.  Being able to move around more easily and for longer periods of time without getting tired (increased stamina).  Finding new sources of fun and enjoyment.  Meeting new people who share a common interest.  Being able to fight off illness better (enhanced  immunity).  Being able to sleep better. What can happen if I am not physically active on a regular basis? Not getting enough physical activity can lead to an unhealthy lifestyle and future health problems. This can increase your chances of:  Becoming overweight or obese.  Becoming sick.  Developing chronic illnesses, like heart disease or diabetes.  Having mental health problems, like depression or anxiety.  Having sleep problems.  Having trouble walking or getting yourself around (reduced mobility).  Injuring yourself in a fall as you get older. What steps can I take to be more physically active?  Check with your health care provider about how to get started. Ask your health care provider what activities are safe for you.  Start out slowly. Walking or doing some simple chair exercises is a good place to start, especially if you have not been active before or for a long time.  Try to find activities that you enjoy. You are more likely to commit to an exercise routine if it does not feel like a chore.  If you have bone or joint problems, choose low-impact exercises, like walking or swimming.  Include physical activity in your everyday routine.  Invite friends or family members to exercise with you. This also will help you commit to your workout plan.  Set goals that you can work toward.  Aim for at least 150 minutes of moderate-intensity exercise each week. Examples of moderate-intensity exercise include walking or riding a bike. Where to find more information  Centers for  Disease Control and Prevention: BowlingGrip.is  President's Council on Fitness, Sports & Nutrition www.http://villegas.org/  ChooseMyPlate: WirelessMortgages.dk Contact a health care provider if:  You have headaches, muscle aches, or joint pain.  You feel dizzy or light-headed while exercising.  You faint.  You have chest pain while  exercising. Summary  Exercise benefits your mind and body at any age, even if you are just starting out.  If you have a chronic illness or have not been active for a while, check with your health care provider before increasing your physical activity.  Choose activities that are safe and enjoyable for you.Ask your health care provider what activities are safe for you.  Start slowly. Tell your health care provider if you have problems as you start to increase your activity level. This information is not intended to replace advice given to you by your health care provider. Make sure you discuss any questions you have with your health care provider. Document Released: 06/05/2016 Document Revised: 06/05/2016 Document Reviewed: 06/05/2016 Elsevier Interactive Patient Education  2019 [place] Following a healthy eating pattern may help you to achieve and maintain a healthy body weight, reduce the risk of chronic disease, and live a long and productive life. It is important to follow a healthy eating pattern at an appropriate calorie level for your body. Your nutritional needs should be met primarily through food by choosing a variety of nutrient-rich foods. What are tips for following this plan? Reading food labels  Read labels and choose the following: ? Reduced or low sodium. ? Juices with 100% fruit juice. ? Foods with low saturated fats and high polyunsaturated and monounsaturated fats. ? Foods with whole grains, such as whole wheat, cracked wheat, brown rice, and wild rice. ? Whole grains that are fortified with folic acid. This is recommended for women who are pregnant or who want to become pregnant.  Read labels and avoid the following: ? Foods with a lot of added sugars. These include foods that contain brown sugar, corn sweetener, corn syrup, dextrose, fructose, glucose, high-fructose corn syrup, honey, invert sugar, lactose, malt syrup, maltose, molasses, raw  sugar, sucrose, trehalose, or turbinado sugar.  Do not eat more than the following amounts of added sugar per day:  6 teaspoons (25 g) for women.  9 teaspoons (38 g) for men. ? Foods that contain processed or refined starches and grains. ? Refined grain products, such as white flour, degermed cornmeal, white bread, and white rice. Shopping  Choose nutrient-rich snacks, such as vegetables, whole fruits, and nuts. Avoid high-calorie and high-sugar snacks, such as potato chips, fruit snacks, and candy.  Use oil-based dressings and spreads on foods instead of solid fats such as butter, stick margarine, or cream cheese.  Limit pre-made sauces, mixes, and "instant" products such as flavored rice, instant noodles, and ready-made pasta.  Try more plant-protein sources, such as tofu, tempeh, black beans, edamame, lentils, nuts, and seeds.  Explore eating plans such as the Mediterranean diet or vegetarian diet. Cooking  Use oil to saut or stir-fry foods instead of solid fats such as butter, stick margarine, or lard.  Try baking, boiling, grilling, or broiling instead of frying.  Remove the fatty part of meats before cooking.  Steam vegetables in water or broth. Meal planning   At meals, imagine dividing your plate into fourths: ? One-half of your plate is fruits and vegetables. ? One-fourth of your plate is whole grains. ? One-fourth of your plate is protein, especially lean  meats, poultry, eggs, tofu, beans, or nuts.  Include low-fat dairy as part of your daily diet. Lifestyle  Choose healthy options in all settings, including home, work, school, restaurants, or stores.  Prepare your food safely: ? Wash your hands after handling raw meats. ? Keep food preparation surfaces clean by regularly washing with hot, soapy water. ? Keep raw meats separate from ready-to-eat foods, such as fruits and vegetables. ? Cook seafood, meat, poultry, and eggs to the recommended internal  temperature. ? Store foods at safe temperatures. In general:  Keep cold foods at 69F (4.4C) or below.  Keep hot foods at 169F (60C) or above.  Keep your freezer at [facility] (-17.8C) or below.  Foods are no longer safe to eat when they have been between the temperatures of 40-169F (4.4-60C) for more than 2 hours. What foods should I eat? Fruits Aim to eat 2 cup-equivalents of fresh, canned (in natural juice), or frozen fruits each day. Examples of 1 cup-equivalent of fruit include 1 small apple, 8 large strawberries, 1 cup canned fruit,  cup dried fruit, or 1 cup 100% juice. Vegetables Aim to eat 2-3 cup-equivalents of fresh and frozen vegetables each day, including different varieties and colors. Examples of 1 cup-equivalent of vegetables include 2 medium carrots, 2 cups raw, leafy greens, 1 cup chopped vegetable (raw or cooked), or 1 medium baked potato. Grains Aim to eat 6 ounce-equivalents of whole grains each day. Examples of 1 ounce-equivalent of grains include 1 slice of bread, 1 cup ready-to-eat cereal, 3 cups popcorn, or  cup cooked rice, pasta, or cereal. Meats and other proteins Aim to eat 5-6 ounce-equivalents of protein each day. Examples of 1 ounce-equivalent of protein include 1 egg, 1/2 cup nuts or seeds, or 1 tablespoon (16 g) peanut butter. A cut of meat or fish that is the size of a deck of cards is about 3-4 ounce-equivalents.  Of the protein you eat each week, try to have at least 8 ounces come from seafood. This includes salmon, trout, herring, and anchovies. Dairy Aim to eat 3 cup-equivalents of fat-free or low-fat dairy each day. Examples of 1 cup-equivalent of dairy include 1 cup (240 mL) milk, 8 ounces (250 g) yogurt, 1 ounces (44 g) natural cheese, or 1 cup (240 mL) fortified soy milk. Fats and oils  Aim for about 5 teaspoons (21 g) per day. Choose monounsaturated fats, such as canola and olive oils, avocados, peanut butter, and most nuts, or polyunsaturated  fats, such as sunflower, corn, and soybean oils, walnuts, pine nuts, sesame seeds, sunflower seeds, and flaxseed. Beverages  Aim for six 8-oz glasses of water per day. Limit coffee to three to five 8-oz cups per day.  Limit caffeinated beverages that have added calories, such as soda and energy drinks.  Limit alcohol intake to no more than 1 drink a day for nonpregnant women and 2 drinks a day for men. One drink equals 12 oz of beer (355 mL), 5 oz of wine (148 mL), or 1 oz of hard liquor (44 mL). Seasoning and other foods  Avoid adding excess amounts of salt to your foods. Try flavoring foods with herbs and spices instead of salt.  Avoid adding sugar to foods.  Try using oil-based dressings, sauces, and spreads instead of solid fats. This information is based on general [place] nutrition guidelines. For more information, visit BuildDNA.es. Exact amounts may vary based on your nutrition needs. Summary  A healthy eating plan may help you to maintain a healthy  weight, reduce the risk of chronic diseases, and stay active throughout your life.  Plan your meals. Make sure you eat the right portions of a variety of nutrient-rich foods.  Try baking, boiling, grilling, or broiling instead of frying.  Choose healthy options in all settings, including home, work, school, restaurants, or stores. This information is not intended to replace advice given to you by your health care provider. Make sure you discuss any questions you have with your health care provider. Document Released: 09/28/2017 Document Revised: 09/28/2017 Document Reviewed: 09/28/2017 Elsevier Interactive Patient Education  2019 [employer].   Get These Tests 1. Blood Pressure- Have your blood pressure checked once a year by your health care provider.  Normal blood pressure is 120/80. 2. Weight- Have your body mass index (BMI) calculated to screen for obesity.  BMI is measure of body fat based on height and weight.  You can  also calculate your own BMI at GravelBags.it. 3. Cholesterol- Have your cholesterol checked every 5 years starting at age 4 then yearly starting at age 15. 68. Chlamydia, HIV, and other sexually transmitted diseases- Get screened every year until age 17, then within three months of each new sexual provider. 5. Pap Test - Every 1-5 years; discuss with your health care provider. 6. Mammogram- Every 1-2 years starting at age 32--50  Take these medicines  Calcium with Vitamin D-Your body needs 1200 mg of Calcium each day and [phone] IU of Vitamin D daily.  Your body can only absorb 500 mg of Calcium at a time so Calcium must be taken in 2 or 3 divided doses throughout the day.  Multivitamin with folic acid- Once daily if it is possible for you to become pregnant.  Get these Immunizations  Gardasil-Series of three doses; prevents HPV related illness such as genital warts and cervical cancer.  Menactra-Single dose; prevents meningitis.  Tetanus shot- Every 10 years.  Flu shot-Every year.  Take these steps 1. Do not smoke-Your healthcare provider can help you quit.  For tips on how to quit go to www.smokefree.gov or call 1-800 QUITNOW. 2. Be physically active- Exercise 5 days a week for at least 30 minutes.  If you are not already physically active, start slow and gradually work up to 30 minutes of moderate physical activity.  Examples of moderate activity include walking briskly, dancing, swimming, bicycling, etc. 3. Breast Cancer- A self breast exam every month is important for early detection of breast cancer.  For more information and instruction on self breast exams, ask your healthcare provider or https://www.patel.info/. 4. Eat a healthy diet- Eat a variety of healthy foods such as fruits, vegetables, whole grains, low fat milk, low fat cheeses, yogurt, lean meats, poultry and fish, beans, nuts, tofu, etc.  For more information go to www.  Thenutritionsource.org 5. Drink alcohol in moderation- Limit alcohol intake to one drink or less per day. Never drink and drive. 6. Depression- Your emotional health is as important as your physical health.  If you're feeling down or losing interest in things you normally enjoy please talk to your healthcare provider about being screened for depression. 7. Dental visit- Brush and floss your teeth twice daily; visit your dentist twice a year. 8. Eye doctor- Get an eye exam at least every 2 years. 9. Helmet use- Always wear a helmet when riding a bicycle, motorcycle, rollerblading or skateboarding. 36. Safe sex- If you may be exposed to sexually transmitted infections, use a condom. 11. Seat belts- Seat belts can save  your live; always wear one. 12. Smoke/Carbon Monoxide detectors- These detectors need to be installed on the appropriate level of your home. Replace batteries at least once a year. 13. Skin cancer- When out in the sun please cover up and use sunscreen 15 SPF or higher. 14. Violence- If anyone is threatening or hurting you, please tell your healthcare provider.

## 2018-08-03 NOTE — Progress Notes (Signed)
New Patient Office Visit  Subjective:  Patient ID: Suzanne [name], female    DOB: 01-27-1991  Age: 28 y.o. MRN: [id number]  CC:  Chief Complaint  Patient presents with  . Establish Care  . Annual Exam    HPI Suzanne [name] presents for to establish care and for complete physical exam. Suzanne [name] is a 28 yr old [place] American female that is G2P2 with a chief complaint of wanting to lose weight. She denies any acute complaints. She endorses that she wants to lose weight but hasn't committed to any lifestyle changes to see improvement. She denies any chest pain, palpitations or shortness of breath.  Past Medical History:  Diagnosis Date  . Obesity     Past Surgical History:  Procedure Laterality Date  . NO PAST SURGERIES    . VAGINAL DELIVERY N/A 10/24/2016   Procedure: VAGINAL DELIVERY;  Surgeon: Lavina Hamman, MD;  Location: [facility] BIRTHING SUITES;  Service: Obstetrics;  Laterality: N/A;    Family History  Problem Relation Age of Onset  . Varicose Veins Mother   . Diabetes Father   . Healthy Child   . Healthy Child   . Healthy Child   . Healthy Child     Social History   Socioeconomic History  . Marital status: Married    Spouse name: Not on file  . Number of children: 4  . Years of education: Not on file  . Highest education level: Not on file  Occupational History  . Not on file  Social Needs  . Financial resource strain: Not on file  . Food insecurity:    Worry: Not on file    Inability: Not on file  . Transportation needs:    Medical: Not on file    Non-medical: Not on file  Tobacco Use  . Smoking status: Never Smoker  . Smokeless tobacco: Never Used  Substance and Sexual Activity  . Alcohol use: No  . Drug use: No  . Sexual activity: Yes    Partners: Male    Birth control/protection: Surgical  Lifestyle  . Physical activity:    Days per week: Not on file    Minutes per session: Not on file  . Stress: Not on file  Relationships  . Social  connections:    Talks on phone: Not on file    Gets together: Not on file    Attends religious service: Not on file    Active member of club or organization: Not on file    Attends meetings of clubs or organizations: Not on file    Relationship status: Not on file  . Intimate partner violence:    Fear of current or ex partner: Not on file    Emotionally abused: Not on file    Physically abused: Not on file    Forced sexual activity: Not on file  Other Topics Concern  . Not on file  Social History Narrative   ** Merged History Encounter **        ROS Review of Systems  Constitutional: Negative for activity change, appetite change, fatigue and unexpected weight change.  HENT: Negative for congestion.   Eyes: Negative for visual disturbance.  Respiratory: Negative for cough, chest tightness and shortness of breath.   Cardiovascular: Negative for chest pain, palpitations and leg swelling.  Gastrointestinal: Negative for abdominal pain, blood in stool, constipation, diarrhea, nausea and vomiting.  Endocrine: Negative for cold intolerance, heat intolerance, polydipsia and polyuria.  Genitourinary: Negative for  dysuria, flank pain, frequency, hematuria and menstrual problem.  Musculoskeletal: Negative for joint swelling.  Skin:       Occasional milk discharge from nipples s/p breast feeding since 2019.   Neurological: Negative for dizziness, light-headedness and headaches.    Objective:   Today's Vitals: BP 126/85   Pulse 84   Resp 17   Ht 5\' 3"  (1.6 m)   Wt 258 lb 9.6 oz (117.3 kg)   LMP 07/24/2018   SpO2 99%   BMI 45.81 kg/m   Physical Exam Constitutional:      Appearance: Normal appearance. She is obese.  HENT:     Head: Normocephalic and atraumatic.     Right Ear: Tympanic membrane, ear canal and external ear normal.     Left Ear: External ear normal. There is impacted cerumen.     Nose: Nose normal.     Mouth/Throat:     Mouth: Mucous membranes are moist.      Pharynx: Oropharynx is clear. No oropharyngeal exudate or posterior oropharyngeal erythema.  Eyes:     Extraocular Movements: Extraocular movements intact.     Conjunctiva/sclera: Conjunctivae normal.     Pupils: Pupils are equal, round, and reactive to light.  Neck:     Musculoskeletal: Normal range of motion and neck supple. No muscular tenderness.     Vascular: No carotid bruit.  Cardiovascular:     Rate and Rhythm: Normal rate and regular rhythm.     Pulses: Normal pulses.     Heart sounds: Normal heart sounds.  Pulmonary:     Effort: Pulmonary effort is normal.     Breath sounds: Normal breath sounds.  Chest:     Breasts:        Right: Normal. No inverted nipple, mass, nipple discharge, skin change or tenderness.        Left: Normal. No inverted nipple, mass, nipple discharge, skin change or tenderness.  Abdominal:     General: Bowel sounds are normal.     Palpations: Abdomen is soft.     Tenderness: There is no right CVA tenderness or left CVA tenderness.  Musculoskeletal: Normal range of motion.  Skin:    General: Skin is warm and dry.     Capillary Refill: Capillary refill takes less than 2 seconds.  Neurological:     Mental Status: She is alert and oriented to person, place, and time.  Psychiatric:        Mood and Affect: Mood normal.        Behavior: Behavior normal.        Thought Content: Thought content normal.        Judgment: Judgment normal.     Assessment & Plan:   1. Encounter to establish care Will follow up as needed, and return to clinic in 1-2 months for PAP  2. Screening, lipid - Lipid Panel  3. Screening for deficiency anemia - CBC with Differential  4. Screening for diabetes mellitus - Comprehensive metabolic panel - Hemoglobin A1c  5. Screening for thyroid disorder - TSH  6. Screening for blood or protein in urine - POCT urine pregnancy - POCT URINALYSIS DIP (CLINITEK)  7. BMI 45.0-49.9, adult [facility](HCC) Refer to medical weight  management program, encouraged healthy lifestyle changes for weightloss. Encouraged efforts to reduce weight include engaging in physical activity as tolerated with goal of 150 minutes per week. Improve dietary choices and eat a meal regimen consistent with a Mediterranean or DASH diet. Reduce simple carbohydrates. Do not skip  meals and eat healthy snacks throughout the day to avoid over-eating at dinner. Set a goal weight loss that is achievable for you.    Outpatient Encounter Medications as of 08/03/2018  Medication Sig  . [EXPIRED] amoxicillin-clavulanate (AUGMENTIN) 875-125 MG tablet Take 1 tablet by mouth 2 (two) times daily for 7 days.  . [DISCONTINUED] benzonatate (TESSALON) 200 MG capsule Take 1 capsule (200 mg total) by mouth 2 (two) times daily as needed for cough.  . [DISCONTINUED] fluticasone (FLONASE) 50 MCG/ACT nasal spray Place 2 sprays into both nostrils daily.  . [DISCONTINUED] loratadine (CLARITIN) 10 MG tablet Take 1 tablet (10 mg total) by mouth daily.   No facility-administered encounter medications on file as of 08/03/2018.     Follow-up: Will follow-up in 1-2 months for a PAP and as needed for acute problems.  A total of 30 minutes spent, greater than 50 % of this time was spent assessing, counseling and coordination of care.   Vincent Gros, RN

## 2018-08-03 NOTE — Progress Notes (Signed)
Patient ID: Suzanne [name], female    DOB: Nov 04, 1990, 28 y.o.   MRN: [id number]  PCP: Bing Neighbors, FNP  Chief Complaint  Patient presents with  . Establish Care  . Annual Exam    Subjective:  HPI Suzanne [name] is a 28 y.o. female, nonsmoker presents for complete physical exam. She is mother of two sets of twins. Endorses lack of physical activity.  Current Body mass index is 45.81 kg/m.  Health Promotion: Health Screening Current/Overdue:   Immunizations-already has seasonal flu vaccine  PAP Over due   Patient denies headaches, shortness of breath, chest pain, dizziness, cough, depression, anxiety, or difficulty sleeping.  Chronic conditions include: Patient Active Problem List   Diagnosis Date Noted  . Obesity 07/23/2018  . Dichorionic diamniotic twin pregnancy in third trimester 10/24/2016  . SVD (spontaneous vaginal delivery) 10/24/2016  . Twin pregnancy 01/13/2013      Current home medications include: Prior to Admission medications   Not on File      Family History  Problem Relation Age of Onset  . Varicose Veins Mother   . Diabetes Father   . Healthy Child   . Healthy Child   . Healthy Child   . Healthy Child      No Known Allergies  Social History   Socioeconomic History  . Marital status: Married    Spouse name: Not on file  . Number of children: 4  . Years of education: Not on file  . Highest education level: Not on file  Occupational History  . Not on file  Social Needs  . Financial resource strain: Not on file  . Food insecurity:    Worry: Not on file    Inability: Not on file  . Transportation needs:    Medical: Not on file    Non-medical: Not on file  Tobacco Use  . Smoking status: Never Smoker  . Smokeless tobacco: Never Used  Substance and Sexual Activity  . Alcohol use: No  . Drug use: No  . Sexual activity: Yes    Partners: Male    Birth control/protection: Surgical  Lifestyle  . Physical activity:    Days per  week: Not on file    Minutes per session: Not on file  . Stress: Not on file  Relationships  . Social connections:    Talks on phone: Not on file    Gets together: Not on file    Attends religious service: Not on file    Active member of club or organization: Not on file    Attends meetings of clubs or organizations: Not on file    Relationship status: Not on file  . Intimate partner violence:    Fear of current or ex partner: Not on file    Emotionally abused: Not on file    Physically abused: Not on file    Forced sexual activity: Not on file  Other Topics Concern  . Not on file  Social History Narrative   ** Merged History Encounter **        Review of Systems Pertinent negatives listed in HPI Past Medical, Surgical Family and Social History reviewed and updated.  Objective:   Today's Vitals   08/03/18 0855  BP: 126/85  Pulse: 84  Resp: 17  SpO2: 99%  Weight: 258 lb 9.6 oz (117.3 kg)  Height: 5\' 3"  (1.6 m)    Wt Readings from Last 3 Encounters:  08/03/18 258 lb 9.6 oz (117.3 kg)  07/23/18 259 lb (117.5 kg)  08/28/17 256 lb (116.1 kg)    Physical Exam Constitutional: Patient appears well-developed and well-nourished. No distress. HENT: Normocephalic, atraumatic, External right and left ear normal.  Eyes: Conjunctivae and EOM are normal. PERRLA, no scleral icterus. Neck: Normal ROM. Neck supple. No JVD. No tracheal deviation. No thyromegaly. CVS: RRR, S1/S2 +, no murmurs, no gallops, no carotid bruit.  Pulmonary: Effort and breath sounds normal, no stridor, rhonchi, wheezes, rales.  Abdominal: Soft. BS +, no distension, tenderness, rebound or guarding.  Musculoskeletal: Normal range of motion. No edema and no tenderness.  Neuro: Alert. Normal reflexes, muscle tone coordination. No cranial nerve deficit. Skin: Skin is warm and dry. No rash noted. Not diaphoretic. No erythema. No pallor. Psychiatric: Normal mood and affect. Behavior, judgment, thought content  normal.   Assessment & Plan:  1. Encounter to establish care  2. Screening, lipid - Lipid Panel  3. Screening for deficiency anemia - CBC with Differential  4. Screening for diabetes mellitus - Comprehensive metabolic panel - Hemoglobin A1c  5. Screening for thyroid disorder - TSH  6. Screening for blood or protein in urine - POCT urine pregnancy - POCT URINALYSIS DIP (CLINITEK)  7. BMI 45.0-49.9, adult (HCC) Encouraged efforts to reduce weight include engaging in physical activity as tolerated with goal of 150 minutes per week. Improve dietary choices and eat a meal regimen consistent with a Mediterranean or DASH diet. Reduce simple carbohydrates. Do not skip meals and eat healthy snacks throughout the day to avoid over-eating at dinner. Set a goal weight loss that is achievable for you. -Patient referred to medical weight management   8. Annual physical Exam -Age appropriate anticipatory guidance provided   Orders Placed This Encounter  Procedures  . CBC with Differential  . Comprehensive metabolic panel  . Lipid Panel  . TSH  . Hemoglobin A1c  . Amb Ref to Medical Weight Management  . POCT urine pregnancy  . POCT URINALYSIS DIP (CLINITEK)    Joaquin Courts, FNP Primary Care at [facility] [address] [place] [address] [phone]: [phone]

## 2018-08-04 LAB — CBC WITH DIFFERENTIAL/PLATELET
BASOS ABS: 0.1 10*3/uL (ref 0.0–0.2)
BASOS: 1 %
EOS (ABSOLUTE): 0.5 10*3/uL — AB (ref 0.0–0.4)
Eos: 8 %
Hematocrit: 38.6 % (ref 34.0–46.6)
Hemoglobin: 12.4 g/dL (ref 11.1–15.9)
Immature Grans (Abs): 0 10*3/uL (ref 0.0–0.1)
Immature Granulocytes: 0 %
LYMPHS ABS: 1.9 10*3/uL (ref 0.7–3.1)
Lymphs: 31 %
MCH: 24.8 pg — AB (ref 26.6–33.0)
MCHC: 32.1 g/dL (ref 31.5–35.7)
MCV: 77 fL — AB (ref 79–97)
Monocytes Absolute: 0.5 10*3/uL (ref 0.1–0.9)
Monocytes: 8 %
NEUTROS ABS: 3.2 10*3/uL (ref 1.4–7.0)
Neutrophils: 52 %
PLATELETS: 312 10*3/uL (ref 150–450)
RBC: 5.01 x10E6/uL (ref 3.77–5.28)
RDW: 14.6 % (ref 11.7–15.4)
WBC: 6.1 10*3/uL (ref 3.4–10.8)

## 2018-08-04 LAB — COMPREHENSIVE METABOLIC PANEL
A/G RATIO: 1.4 (ref 1.2–2.2)
ALBUMIN: 4.2 g/dL (ref 3.9–5.0)
ALK PHOS: 95 IU/L (ref 39–117)
ALT: 31 IU/L (ref 0–32)
AST: 18 IU/L (ref 0–40)
BILIRUBIN TOTAL: 0.3 mg/dL (ref 0.0–1.2)
BUN / CREAT RATIO: 12 (ref 9–23)
BUN: 8 mg/dL (ref 6–20)
CHLORIDE: 103 mmol/L (ref 96–106)
CO2: 23 mmol/L (ref 20–29)
Calcium: 9.5 mg/dL (ref 8.7–10.2)
Creatinine, Ser: 0.67 mg/dL (ref 0.57–1.00)
GFR calc non Af Amer: 119 mL/min/{1.73_m2} (ref >59)
GFR, EST AFRICAN AMERICAN: 137 mL/min/{1.73_m2} (ref >59)
Globulin, Total: 3.1 g/dL (ref 1.5–4.5)
Glucose: 92 mg/dL (ref 65–99)
POTASSIUM: 4.4 mmol/L (ref 3.5–5.2)
Sodium: 140 mmol/L (ref 134–144)
Total Protein: 7.3 g/dL (ref 6.0–8.5)

## 2018-08-04 LAB — LIPID PANEL
CHOL/HDL RATIO: 3.9 ratio (ref 0.0–4.4)
Cholesterol, Total: 174 mg/dL (ref 100–199)
HDL: 45 mg/dL (ref >39)
LDL Calculated: 115 mg/dL — ABNORMAL HIGH (ref 0–99)
Triglycerides: 71 mg/dL (ref 0–149)
VLDL Cholesterol Cal: 14 mg/dL (ref 5–40)

## 2018-08-04 LAB — TSH: TSH: 1.75 u[IU]/mL (ref 0.450–4.500)

## 2018-08-04 LAB — HEMOGLOBIN A1C
Est. average glucose Bld gHb Est-mCnc: 117 mg/dL
HEMOGLOBIN A1C: 5.7 % — AB (ref 4.8–5.6)

## 2018-08-05 ENCOUNTER — Telehealth: Payer: Self-pay | Admitting: Family Medicine

## 2018-08-06 NOTE — Progress Notes (Signed)
Patient notified of results & recommendations. Expressed understanding. States that she had read the MyChart message & had no additional questions.

## 2018-08-10 ENCOUNTER — Ambulatory Visit: Payer: 59 | Admitting: Registered"

## 2018-08-10 NOTE — Telephone Encounter (Signed)
Spoke with patient and informed of lab results and plan of action for lifestyle changes

## 2018-08-20 ENCOUNTER — Ambulatory Visit: Payer: 59 | Admitting: Registered"

## 2018-10-01 ENCOUNTER — Telehealth: Payer: Self-pay

## 2018-10-01 NOTE — Telephone Encounter (Signed)
Called patient to do their pre-visit COVID screening.  Call went to voicemail. 

## 2018-10-04 ENCOUNTER — Ambulatory Visit (INDEPENDENT_AMBULATORY_CARE_PROVIDER_SITE_OTHER): Payer: 59 | Admitting: Family Medicine

## 2018-10-04 ENCOUNTER — Other Ambulatory Visit (HOSPITAL_COMMUNITY)
Admission: RE | Admit: 2018-10-04 | Discharge: 2018-10-04 | Disposition: A | Discharge status: Home / Self Care | Payer: 59 | Source: Ambulatory Visit | Attending: Family Medicine | Admitting: Family Medicine

## 2018-10-04 ENCOUNTER — Other Ambulatory Visit: Payer: Self-pay

## 2018-10-04 ENCOUNTER — Encounter: Payer: Self-pay | Admitting: Family Medicine

## 2018-10-04 VITALS — BP 110/77 | HR 85 | Temp 98.2°F | Resp 17 | Ht 63.0 in | Wt 236.8 lb

## 2018-10-04 DIAGNOSIS — E669 Obesity, unspecified: Secondary | ICD-10-CM

## 2018-10-04 DIAGNOSIS — Z01411 Encounter for gynecological examination (general) (routine) with abnormal findings: Secondary | ICD-10-CM | POA: Diagnosis not present

## 2018-10-04 DIAGNOSIS — Z1389 Encounter for screening for other disorder: Secondary | ICD-10-CM | POA: Diagnosis not present

## 2018-10-04 DIAGNOSIS — Z6841 Body Mass Index (BMI) 40.0 and over, adult: Secondary | ICD-10-CM | POA: Diagnosis not present

## 2018-10-04 DIAGNOSIS — Z3202 Encounter for pregnancy test, result negative: Secondary | ICD-10-CM | POA: Diagnosis not present

## 2018-10-04 DIAGNOSIS — Z01419 Encounter for gynecological examination (general) (routine) without abnormal findings: Secondary | ICD-10-CM | POA: Insufficient documentation

## 2018-10-04 LAB — POCT URINALYSIS DIP (CLINITEK)
Bilirubin, UA: NEGATIVE
Blood, UA: NEGATIVE
Glucose, UA: NEGATIVE mg/dL
Ketones, POC UA: NEGATIVE mg/dL
Leukocytes, UA: NEGATIVE
Nitrite, UA: NEGATIVE
POC PROTEIN,UA: NEGATIVE
Spec Grav, UA: >=1.03 — AB (ref 1.010–1.025)
Urobilinogen, UA: 0.2 E.U./dL
pH, UA: 6 (ref 5.0–8.0)

## 2018-10-04 LAB — POCT URINE PREGNANCY: Preg Test, Ur: NEGATIVE

## 2018-10-04 NOTE — Progress Notes (Signed)
Patient ID: Suzanne [name], female    DOB: [birth year], 28 y.o.   MRN: [id number]  PCP: Suzanne [name]  Chief Complaint  Patient presents with  . Gynecologic Exam    Subjective:  HPI Suzanne [name] is a 28 y.o. female presents gynecological exam.  Suzanne [name] medical history includes has Dichorionic diamniotic twin pregnancy in third trimester; SVD (spontaneous vaginal delivery); Twin pregnancy; and Obesity.   No history of abnormal PAP. Denies any abnormal vaginal discharge, irritation, dysuria, or tenderness. Performs routine self-breast exams. No family history gynecological or breast cancer.   Social History   Socioeconomic History  . Marital status: Married    Spouse name: Not on file  . Number of children: 4  . Years of education: Not on file  . Highest education level: Not on file  Occupational History  . Not on file  Social Needs  . Financial resource strain: Not on file  . Food insecurity:    Worry: Not on file    Inability: Not on file  . Transportation needs:    Medical: Not on file    Non-medical: Not on file  Tobacco Use  . Smoking status: Never Smoker  . Smokeless tobacco: Never Used  Substance and Sexual Activity  . Alcohol use: No  . Drug use: No  . Sexual activity: Yes    Partners: Male    Birth control/protection: Surgical  Lifestyle  . Physical activity:    Days per week: Not on file    Minutes per session: Not on file  . Stress: Not on file  Relationships  . Social connections:    Talks on phone: Not on file    Gets together: Not on file    Attends religious service: Not on file    Active member of club or organization: Not on file    Attends meetings of clubs or organizations: Not on file    Relationship status: Not on file  . Intimate partner violence:    Fear of current or ex partner: Not on file    Emotionally abused: Not on file    Physically abused: Not on file    Forced sexual activity: Not on file  Other Topics Concern  .  Not on file  Social History Narrative   ** Merged History Encounter **        Family History  Problem Relation Age of Onset  . Varicose Veins Mother   . Diabetes Father   . Healthy Child   . Healthy Child   . Healthy Child   . Healthy Child    Review of Systems Pertinent negatives listed in HPI Patient Active Problem List   Diagnosis Date Noted  . Obesity 07/23/2018  . Dichorionic diamniotic twin pregnancy in third trimester 10/24/2016  . SVD (spontaneous vaginal delivery) 10/24/2016  . Twin pregnancy 01/13/2013    No Known Allergies  Prior to Admission medications   Not on File    Past Medical, Surgical Family and Social History reviewed and updated.    Objective:   Today's Vitals   10/04/18 0922  BP: 110/77  Pulse: 85  Resp: 17  Temp: 98.2 F (36.8 C)  TempSrc: Oral  SpO2: 99%  Weight: 236 lb 12.8 oz (107.4 kg)  Height: 5\' 3"  (1.6 m)    Wt Readings from Last 3 Encounters:  10/04/18 236 lb 12.8 oz (107.4 kg)  08/03/18 258 lb 9.6 oz (117.3 kg)  07/23/18 259 lb (117.5 kg)  Physical Exam General appearance: alert, well developed, well nourished, cooperative and in no distress Head: Normocephalic, without obvious abnormality, atraumatic Respiratory: Respirations even and unlabored, normal respiratory rate Heart: rate and rhythm normal. No gallop or murmurs noted on exam  Extremities: No gross deformities Genitourinary: Breasts are symmetric without cutaneous changes, nipple inversion or discharge. No masses or tenderness, and no axillary lymphadenopathy. Normal female external genitalia without lesion. No inguinal lymphadenopathy. Vaginal mucosa is pink and moist without lesions. Cervix is closed without discharge, not friable. Pap smear obtained. No cervical motion tenderness, adnexal fullness or tenderness. Skin: Skin color, texture, turgor normal. No rashes seen  Psych: Appropriate mood and affect. Neurologic: Mental status: Alert, oriented to  person, place, and time, thought content appropriate.  Lab Results  Component Value Date   HGBA1C 5.7 (H) 08/03/2018    Assessment & Plan:  1. Encounter for gynecological examination - Cytology - PAP([facility]) - Cervicovaginal ancillary only - POCT urine pregnancy - POCT URINALYSIS DIP (CLINITEK)    -The patient was given clear instructions to go to ER or return to medical center if symptoms do not improve, worsen or new problems develop. The patient verbalized understanding.    Joaquin Courts, [name] Primary Care at [facility], [place] [address], [place] [address] [phone]: [phone]

## 2018-10-04 NOTE — Patient Instructions (Signed)
Pap Test  Why am I having this test?  A Pap test, also called a Pap smear, is a screening test to check for signs of:  · Cancer of the vagina, cervix, and uterus. The cervix is the lower part of the uterus that opens into the vagina.  · Infection.  · Changes that may be a sign that cancer is developing (precancerous changes).  Women need this test on a regular basis. In general, you should have a Pap test every 3 years until you reach menopause or age 30. Women aged 30-60 may choose to have their Pap test done at the same time as an HPV (human papillomavirus) test every 5 years (instead of every 3 years).  Your health care provider may recommend having Pap tests more or less often depending on your medical conditions and past Pap test results.  What kind of sample is taken?    Your health care provider will collect a sample of cells from the surface of your cervix. This will be done using a small cotton swab, plastic spatula, or brush. This sample is often collected during a pelvic exam, when you are lying on your back on an exam table with feet in footrests (stirrups).  In some cases, fluids (secretions) from the cervix or vagina may also be collected.  How do I prepare for this test?  · Be aware of where you are in your menstrual cycle. If you are menstruating on the day of the test, you may be asked to reschedule.  · You may need to reschedule if you have a known vaginal infection on the day of the test.  · Follow instructions from your health care provider about:  ? Changing or stopping your regular medicines. Some medicines can cause abnormal test results, such as digitalis and tetracycline.  ? Avoiding douching or taking a bath the day before or the day of the test.  Tell a health care provider about:  · Any allergies you have.  · All medicines you are taking, including vitamins, herbs, eye drops, creams, and over-the-counter medicines.  · Any blood disorders you have.  · Any surgeries you have had.  · Any  medical conditions you have.  · Whether you are pregnant or may be pregnant.  How are the results reported?  Your test results will be reported as either abnormal or normal.  A false-positive result can occur. A false positive is incorrect because it means that a condition is present when it is not.  A false-negative result can occur. A false negative is incorrect because it means that a condition is not present when it is.  What do the results mean?  A normal test result means that you do not have signs of cancer of the vagina, cervix, or uterus.  An abnormal result may mean that you have:  · Cancer. A Pap test by itself is not enough to diagnose cancer. You will have more tests done in this case.  · Precancerous changes in your vagina, cervix, or uterus.  · Inflammation of the cervix.  · An STD (sexually transmitted disease).  · A fungal infection.  · A parasite infection.  Talk with your health care provider about what your results mean.  Questions to ask your health care provider  Ask your health care provider, or the department that is doing the test:  · When will my results be ready?  · How will I get my results?  · What are my   treatment options?  · What other tests do I need?  · What are my next steps?  Summary  · In general, women should have a Pap test every 3 years until they reach menopause or age 30.  · Your health care provider will collect a sample of cells from the surface of your cervix. This will be done using a small cotton swab, plastic spatula, or brush.  · In some cases, fluids (secretions) from the cervix or vagina may also be collected.  This information is not intended to replace advice given to you by your health care provider. Make sure you discuss any questions you have with your health care provider.  Document Released: 09/06/2002 Document Revised: 02/23/2017 Document Reviewed: 02/23/2017  Elsevier Interactive Patient Education © 2019 [employer]

## 2018-10-05 LAB — CYTOLOGY - PAP
Bacterial vaginitis: NEGATIVE
Candida vaginitis: POSITIVE — AB
Chlamydia: NEGATIVE
Neisseria Gonorrhea: NEGATIVE
Trichomonas: NEGATIVE

## 2018-10-05 LAB — CERVICOVAGINAL ANCILLARY ONLY: Diagnosis: NEGATIVE

## 2018-10-06 ENCOUNTER — Other Ambulatory Visit: Payer: Self-pay | Admitting: Family Medicine

## 2018-10-06 MED ORDER — FLUCONAZOLE 150 MG PO TABS: 150.0000 mg | ORAL_TABLET | Freq: Once | ORAL | 0 refills | Status: AC

## 2018-10-06 NOTE — Telephone Encounter (Signed)
PAP was negative for abnormal cells, however did indicate the presence of yeast. I am prescribing Diflucan 150 mg once to clear yeast. Please verify pharmacy due to this is an acute medication and [facility] is currently mail order.

## 2018-10-06 NOTE — Telephone Encounter (Signed)
Patient notified of pap results & recommendations. Rx released to patient's local pharmacy.

## 2018-12-15 ENCOUNTER — Telehealth (INDEPENDENT_AMBULATORY_CARE_PROVIDER_SITE_OTHER): Payer: 59 | Admitting: Family Medicine

## 2018-12-15 ENCOUNTER — Telehealth: Payer: Self-pay | Admitting: Family Medicine

## 2018-12-15 DIAGNOSIS — N760 Acute vaginitis: Secondary | ICD-10-CM | POA: Diagnosis not present

## 2018-12-15 MED ORDER — FLUCONAZOLE 150 MG PO TABS: 150.0000 mg | ORAL_TABLET | Freq: Once | ORAL | 0 refills | Status: AC

## 2018-12-15 NOTE — Telephone Encounter (Signed)
Patient saw the dentist and thinks she has a yeast infection wants you to send something to [facility] on [address] please let her know through [place]

## 2018-12-15 NOTE — Progress Notes (Deleted)
Called patient to initiate their telephone visit with provider Molli Barrows, FNP-C. Verified date of birth. Patient recently saw her dentist & was started on Amoxicillin. She has c/o clumpy discharge & itching. KWalker, CMA.

## 2018-12-15 NOTE — Telephone Encounter (Signed)
Patient not seen since April. Please add to schedule as a phone or my chart encounter.

## 2018-12-15 NOTE — Progress Notes (Signed)
Virtual Visit via Telephone Note  I connected with Suzanne [name] on 12/15/18 at  2:50 PM EDT by telephone and verified that I am speaking with the correct person using two identifiers.  Location: Patient: Located at home during today's encounter  Provider: Located at primary care office     I discussed the limitations, risks, security and privacy concerns of performing an evaluation and management service by telephone and the availability of in person appointments. I also discussed with the patient that there may be a patient responsible charge related to this service. The patient expressed understanding and agreed to proceed.   History of Present Illness: Patient recently prescribed Amoxicillin by dentist after a wisdom tooth extraction.  She reports after taking the medication for approximately 3 days she developed vaginal discomfort and irritation.  She denies any changes in odor or in texture or color discharge.  No concern for STD.  Patient recently had a yeast infection back in August and was treated with Diflucan no symptoms did not completely resolved.  She denies any urinary symptoms.   Assessment and Plan: 1. Vaginitis and vulvovaginitis -We will trial Diflucan 150 mg once advised to repeat in 3 days if vaginal irritation does not resolve.  Given that patient is prediabetic advised her if symptoms do not resolve or recur within a short.  Of time please follow-up here in the office for repeat A1c.  Follow Up Instructions: CPE April 2021   I discussed the assessment and treatment plan with the patient. The patient was provided an opportunity to ask questions and all were answered. The patient agreed with the plan and demonstrated an understanding of the instructions.   The patient was advised to call back or seek an in-person evaluation if the symptoms worsen or if the condition fails to improve as anticipated.  I provided 15 minutes of non-face-to-face time during this  encounter.   Molli Barrows, FNP

## 2019-07-05 DIAGNOSIS — Z20828 Contact with and (suspected) exposure to other viral communicable diseases: Secondary | ICD-10-CM | POA: Diagnosis not present

## 2019-07-21 ENCOUNTER — Telehealth: Payer: 59 | Admitting: Physician Assistant

## 2019-07-21 DIAGNOSIS — R05 Cough: Secondary | ICD-10-CM

## 2019-07-21 DIAGNOSIS — R059 Cough, unspecified: Secondary | ICD-10-CM

## 2019-07-21 DIAGNOSIS — U071 COVID-19: Secondary | ICD-10-CM | POA: Diagnosis not present

## 2019-07-21 MED ORDER — BENZONATATE 100 MG PO CAPS: 100.0000 mg | ORAL_CAPSULE | Freq: Three times a day (TID) | ORAL | 0 refills | Status: DC | PRN

## 2019-07-21 NOTE — Progress Notes (Signed)
Your test for COVID-19 was positive, meaning that you were infected with the novel coronavirus and could give the germ to others.    Please read the below information.  I will send a prescription to the pharmacy for your cough. Given that we know COVID is viral illness that can cause persistent cough, we have not been treating ongoing COVID coughs with antibiotics. I would expect you to have a cough for up to several weeks after your initial COVID infection.    You have been enrolled in [facility] for COVID-19. Daily you will receive a questionnaire within the [facility] website. Our COVID-19 response team will be monitoring your responses daily.  Please continue isolation at home, for at least 10 days since the start of your symptoms and until you have had 24 hours with no fever (without taking a fever reducer) and with improving of symptoms.  Please continue good preventive care measures, including:  frequent hand-washing, avoid touching your face, cover coughs/sneezes, stay out of crowds and keep a 6 foot distance from others.  Recheck or go to the nearest hospital ED tent for re-assessment if fever/cough/breathlessness return. he following symptoms may appear 2-14 days after exposure: . Fever . Cough . Shortness of breath or difficulty breathing . Chills . Repeated shaking with chills . Muscle pain . Headache . Sore throat . New loss of taste or smell . Fatigue . Congestion or runny nose . Nausea or vomiting . Diarrhea  Go to the nearest hospital ED for assessment if fever/cough/breathlessness are severe or illness seems like a threat to life.  It is vitally important that if you feel that you have an infection such as this virus or any other virus that you stay home and away from places where you may spread it to others.  You should avoid contact with people age 66 and older.   You can use medication such as A prescription cough medication called Tessalon Perles 100 mg. You may  take 1-2 capsules every 8 hours as needed for cough    You may also take acetaminophen (Tylenol) as needed for fever.  Reduce your risk of any infection by using the same precautions used for avoiding the common cold or flu:  Marland Kitchen Wash your hands often with soap and warm water for at least 20 seconds.  If soap and water are not readily available, use an alcohol-based hand sanitizer with at least 60% alcohol.  . If coughing or sneezing, cover your mouth and nose by coughing or sneezing into the elbow areas of your shirt or coat, into a tissue or into your sleeve (not your hands). . Avoid shaking hands with others and consider head nods or verbal greetings only. . Avoid touching your eyes, nose, or mouth with unwashed hands.  . Avoid close contact with people who are sick. . Avoid places or events with large numbers of people in one location, like concerts or sporting events. . Carefully consider travel plans you have or are making. . If you are planning any travel outside or inside the [place], visit the CDC's Travelers' Health webpage for the latest health notices. . If you have some symptoms but not all symptoms, continue to monitor at home and seek medical attention if your symptoms worsen. . If you are having a medical emergency, call 911.  HOME CARE . Only take medications as instructed by your medical team. . Drink plenty of fluids and get plenty of rest. . A steam or ultrasonic  humidifier can help if you have congestion.   GET HELP RIGHT AWAY IF YOU HAVE EMERGENCY WARNING SIGNS** FOR COVID-19. If you or someone is showing any of these signs seek emergency medical care immediately. Call 911 or proceed to your closest emergency facility if: . You develop worsening high fever. . Trouble breathing . Bluish lips or face . Persistent pain or pressure in the chest . New confusion . Inability to wake or stay awake . You cough up blood. . Your symptoms become more severe  **This list is not all  possible symptoms. Contact your medical provider for any symptoms that are sever or concerning to you.  MAKE SURE YOU   Understand these instructions.  Will watch your condition.  Will get help right away if you are not doing well or get worse.  Your e-visit answers were reviewed by a board certified advanced clinical practitioner to complete your personal care plan.  Depending on the condition, your plan could have included both over the counter or prescription medications.  If there is a problem please reply once you have received a response from your provider.  Your safety is important to [place].  If you have drug allergies check your prescription carefully.    You can use MyChart to ask questions about today's visit, request a non-urgent call back, or ask for a work or school excuse for 24 hours related to this e-Visit. If it has been greater than 24 hours you will need to follow up with your provider, or enter a new e-Visit to address those concerns. You will get an e-mail in the next two days asking about your experience.  I hope that your e-visit has been valuable and will speed your recovery. Thank you for using e-visits.     Greater than 5 minutes, yet less than 10 minutes of time have been spent researching, coordinating, and implementing care for this patient today

## 2019-08-24 ENCOUNTER — Other Ambulatory Visit: Payer: Self-pay

## 2019-08-24 ENCOUNTER — Emergency Department (HOSPITAL_COMMUNITY): Payer: 59

## 2019-08-24 ENCOUNTER — Emergency Department (HOSPITAL_COMMUNITY)
Admission: EM | Admit: 2019-08-24 | Discharge: 2019-08-24 | Disposition: A | Discharge status: Home / Self Care | Payer: 59 | Attending: Emergency Medicine | Admitting: Emergency Medicine

## 2019-08-24 ENCOUNTER — Encounter (HOSPITAL_COMMUNITY): Payer: Self-pay | Admitting: *Deleted

## 2019-08-24 DIAGNOSIS — R0789 Other chest pain: Secondary | ICD-10-CM | POA: Diagnosis not present

## 2019-08-24 DIAGNOSIS — R079 Chest pain, unspecified: Secondary | ICD-10-CM | POA: Diagnosis not present

## 2019-08-24 LAB — CBC WITH DIFFERENTIAL/PLATELET
Abs Immature Granulocytes: 0.01 10*3/uL (ref 0.00–0.07)
Basophils Absolute: 0 10*3/uL (ref 0.0–0.1)
Basophils Relative: 1 %
Eosinophils Absolute: 0.3 10*3/uL (ref 0.0–0.5)
Eosinophils Relative: 6 %
HCT: 36.5 % (ref 36.0–46.0)
Hemoglobin: 11.6 g/dL — ABNORMAL LOW (ref 12.0–15.0)
Immature Granulocytes: 0 %
Lymphocytes Relative: 28 %
Lymphs Abs: 1.5 10*3/uL (ref 0.7–4.0)
MCH: 24.5 pg — ABNORMAL LOW (ref 26.0–34.0)
MCHC: 31.8 g/dL (ref 30.0–36.0)
MCV: 77.2 fL — ABNORMAL LOW (ref 80.0–100.0)
Monocytes Absolute: 0.5 10*3/uL (ref 0.1–1.0)
Monocytes Relative: 9 %
Neutro Abs: 3 10*3/uL (ref 1.7–7.7)
Neutrophils Relative %: 56 %
Platelets: 368 10*3/uL (ref 150–400)
RBC: 4.73 MIL/uL (ref 3.87–5.11)
RDW: 15.9 % — ABNORMAL HIGH (ref 11.5–15.5)
WBC: 5.4 10*3/uL (ref 4.0–10.5)
nRBC: 0 % (ref 0.0–0.2)

## 2019-08-24 LAB — BASIC METABOLIC PANEL
Anion gap: 8 (ref 5–15)
BUN: 7 mg/dL (ref 6–20)
CO2: 24 mmol/L (ref 22–32)
Calcium: 9.3 mg/dL (ref 8.9–10.3)
Chloride: 104 mmol/L (ref 98–111)
Creatinine, Ser: 0.72 mg/dL (ref 0.44–1.00)
GFR calc Af Amer: >60 mL/min (ref >60)
GFR calc non Af Amer: >60 mL/min (ref >60)
Glucose, Bld: 95 mg/dL (ref 70–99)
Potassium: 3.8 mmol/L (ref 3.5–5.1)
Sodium: 136 mmol/L (ref 135–145)

## 2019-08-24 LAB — D-DIMER, QUANTITATIVE: D-Dimer, Quant: 1.3 ug/mL-FEU — ABNORMAL HIGH (ref 0.00–0.50)

## 2019-08-24 LAB — TROPONIN I (HIGH SENSITIVITY)
Troponin I (High Sensitivity): <2 ng/L (ref <18)
Troponin I (High Sensitivity): <2 ng/L (ref <18)

## 2019-08-24 LAB — I-STAT BETA HCG BLOOD, ED (MC, WL, AP ONLY): I-stat hCG, quantitative: <5 m[IU]/mL (ref <5)

## 2019-08-24 MED ORDER — IOHEXOL 350 MG/ML SOLN
150.0000 mL | Freq: Once | INTRAVENOUS | Status: AC | PRN
  Administered 2019-08-24: 14:00:00 150 mL via INTRAVENOUS

## 2019-08-24 NOTE — ED Notes (Signed)
Pt transported to CT at this time.

## 2019-08-24 NOTE — ED Notes (Signed)
Pt discharged at this time. Discharge instructions reviewed, opportunity to ask questions provided, pt verbalizes understanding of discharge instructions.   

## 2019-08-24 NOTE — ED Provider Notes (Signed)
[facility] EMERGENCY DEPARTMENT Provider Note   CSN: [id number] Arrival date & time: 08/24/19  2122     History Chief Complaint  Patient presents with  . Chest Pain    Suzanne [name] is a 29 y.o. female with no significant past medical history who presents to the ED due to gradual onset of worsening central chest pain that radiates down her left arm x1.5 months.  Patient states her symptoms worsened after she tested positive for Covid on 07/13/2019.  Patient admits to history of severe GERD which is normally relieved by Prilosec, Tums, and Pepcid; however, this chest pain has not been relieved and has gradually worsened.  Patient describes pain as tightness and heaviness.  Chest pain is worse when lying flat and after acidic meals.  Patient denies associative shortness of breath, nausea, vomiting, and lower extremity edema.  No relationship to exertion.  She denies past cardiac issues.  Denies recent illness except for COVID infection in January.  Patient denies history of blood clots, recent surgeries, recent long immobilizations, and hormonal treatments. Denies abdominal pain, fever, and chills.  Patient is currently chest pain-free.  HPI: A 29 year old patient with a history of obesity presents for evaluation of chest pain. Initial onset of pain was approximately 3-6 hours ago. The patient's chest pain is well-localized, is described as heaviness/pressure/tightness and is not worse with exertion. The patient's chest pain is middle- or left-sided, is not sharp and does not radiate to the arms/jaw/neck. The patient does not complain of nausea and denies diaphoresis. The patient has no history of stroke, has no history of peripheral artery disease, has not smoked in the past 90 days, denies any history of treated diabetes, has no relevant family history of coronary artery disease (first degree relative at less than age 71), is not hypertensive and has no history of  hypercholesterolemia.   Past Medical History:  Diagnosis Date  . Obesity     Patient Active Problem List   Diagnosis Date Noted  . Obesity 07/23/2018  . Dichorionic diamniotic twin pregnancy in third trimester 10/24/2016  . SVD (spontaneous vaginal delivery) 10/24/2016  . Twin pregnancy 01/13/2013    Past Surgical History:  Procedure Laterality Date  . NO PAST SURGERIES    . VAGINAL DELIVERY N/A 10/24/2016   Procedure: VAGINAL DELIVERY;  Surgeon: Lavina Hamman, MD;  Location: [facility] BIRTHING SUITES;  Service: Obstetrics;  Laterality: N/A;     OB History    Gravida  3   Para  3   Term  3   Preterm  0   AB  0   Living  6     SAB  0   TAB  0   Ectopic  0   Multiple  3   Live Births  6           Family History  Problem Relation Age of Onset  . Varicose Veins Mother   . Diabetes Father   . Healthy Child   . Healthy Child   . Healthy Child   . Healthy Child     Social History   Tobacco Use  . Smoking status: Never Smoker  . Smokeless tobacco: Never Used  Substance Use Topics  . Alcohol use: No  . Drug use: No    Home Medications Prior to Admission medications   Not on File    Allergies    Patient has no known allergies.  Review of Systems   Review of Systems  Constitutional: Negative for chills and fever.  Respiratory: Negative for shortness of breath.   Cardiovascular: Positive for chest pain. Negative for leg swelling.  Gastrointestinal: Negative for abdominal pain, diarrhea, nausea and vomiting.  Musculoskeletal: Positive for arthralgias (left arm pain).  Neurological: Negative for numbness.  All other systems reviewed and are negative.   Physical Exam Updated Vital Signs BP 124/74   Pulse (!) 101   Temp 98.7 F (37.1 C) (Oral)   Resp (!) 23   Ht 5\' 3"  (1.6 m)   Wt 107 kg   LMP 07/01/2019   SpO2 100%   BMI 41.81 kg/m   Physical Exam Vitals and nursing note reviewed.  Constitutional:      General: She is not in acute  distress.    Appearance: She is not ill-appearing.  HENT:     Head: Normocephalic.  Eyes:     Pupils: Pupils are equal, round, and reactive to light.  Cardiovascular:     Rate and Rhythm: Regular rhythm. Tachycardia present.     Pulses: Normal pulses.     Heart sounds: Normal heart sounds. No murmur. No friction rub. No gallop.   Pulmonary:     Effort: Pulmonary effort is normal.     Breath sounds: Normal breath sounds.  Chest:     Comments: Anterior chest wall tenderness. Abdominal:     General: Abdomen is flat. Bowel sounds are normal. There is no distension.     Palpations: Abdomen is soft.     Tenderness: There is no abdominal tenderness. There is no guarding or rebound.     Comments: Negative Murphy sign.  Musculoskeletal:     Cervical back: Neck supple.     Comments: Able to move all 4 extremities without difficulty.  No lower extremity edema.  Negative Homans sign bilaterally.  Skin:    General: Skin is warm and dry.  Neurological:     General: No focal deficit present.     Mental Status: She is alert.  Psychiatric:        Mood and Affect: Mood normal.        Behavior: Behavior normal.     ED Results / Procedures / Treatments   Labs (all labs ordered are listed, but only abnormal results are displayed) Labs Reviewed  CBC WITH DIFFERENTIAL/PLATELET - Abnormal; Notable for the following components:      Result Value   Hemoglobin 11.6 (*)    MCV 77.2 (*)    MCH 24.5 (*)    RDW 15.9 (*)    All other components within normal limits  D-DIMER, QUANTITATIVE (NOT AT [facility]) - Abnormal; Notable for the following components:   D-Dimer, Quant 1.30 (*)    All other components within normal limits  BASIC METABOLIC PANEL  I-STAT BETA HCG BLOOD, ED ([facility], [facility], [facility] ONLY)  TROPONIN I (HIGH SENSITIVITY)  TROPONIN I (HIGH SENSITIVITY)    EKG EKG Interpretation  Date/Time:  Wednesday August 24 2019 09:59:24 EST Ventricular Rate:  107 PR Interval:    QRS Duration: 93 QT  Interval:  335 QTC Calculation: 447 R Axis:   65 Text Interpretation: Sinus tachycardia Low voltage, precordial leads Borderline T abnormalities, anterior leads Confirmed by 08[birth year]1988 [phone]) on 08/24/2019 10:06:06 AM   Radiology CT Angio Chest PE W and/or Wo Contrast  Result Date: 08/24/2019 CLINICAL DATA:  Chest pain EXAM: CT ANGIOGRAPHY CHEST WITH CONTRAST TECHNIQUE: Multidetector CT imaging of the chest was performed using the standard protocol during bolus administration of  intravenous contrast. Multiplanar CT image reconstructions and MIPs were obtained to evaluate the vascular anatomy. CONTRAST:  148mL OMNIPAQUE IOHEXOL 350 MG/ML SOLN COMPARISON:  Chest radiograph August 24, 2019. FINDINGS: Cardiovascular: There is no demonstrable pulmonary embolus. There is no thoracic aortic aneurysm or dissection. Visualized great vessels appear normal. No pericardial effusion or pericardial thickening. Mediastinum/Nodes: Thyroid appears unremarkable. There is no appreciable thoracic adenopathy. There is a focal hiatal hernia. Lungs/Pleura: There is slight bibasilar atelectasis. The lungs elsewhere are clear. No pleural effusions evident. Upper Abdomen: Visualized upper abdominal structures appear unremarkable. Musculoskeletal: No blastic or lytic bone lesions. No chest wall lesions evident. Review of the MIP images confirms the above findings. IMPRESSION: 1. No demonstrable pulmonary embolus. No thoracic aortic aneurysm or dissection. 2.  Slight bibasilar atelectasis.  Lungs elsewhere clear. 3.  No evident adenopathy. 4.  Hiatal hernia present. Electronically Signed   By: Lowella Grip III M.D.   On: 08/24/2019 14:06   DG Chest Portable 1 View  Result Date: 08/24/2019 CLINICAL DATA:  Chest pain. EXAM: PORTABLE CHEST 1 VIEW COMPARISON:  None. FINDINGS: Normal heart size and mediastinal contours. No acute infiltrate or edema. No effusion or pneumothorax. No acute osseous findings. Artifact from EKG  leads IMPRESSION: Negative chest. Electronically Signed   By: Monte Fantasia M.D.   On: 08/24/2019 10:54    Procedures Procedures (including critical care time)  Medications Ordered in ED Medications  iohexol (OMNIPAQUE) 350 MG/ML injection 150 mL (150 mLs Intravenous Contrast Given 08/24/19 1340)    ED Course  I have reviewed the triage vital signs and the nursing notes.  Pertinent labs & imaging results that were available during my care of the patient were reviewed by me and considered in my medical decision making (see chart for details).  Clinical Course as of Aug 23 1416  Wed Aug 24, 2019  1122 D-Dimer, Quant(!): 1.30 [CA]  1122 I-stat hCG, quantitative: <5.0 [CA]  1258 Troponin I (High Sensitivity): <2 [CA]  1401 Troponin I (High Sensitivity): <2 [CA]    Clinical Course User Index [CA] Suzy Bouchard, PA-C   MDM Rules/Calculators/A&P HEAR Score: 2                   28 year old female presents to the ED due to chest tightness that worsened after Covid infection on 07/13/2019.  Denies history of blood clots, recent surgeries, recent long immobilizations, and hormonal treatments.  Vitals all within normal limits at triage; however, during my initial evaluation patient was tachycardic at 106.  Patient in no acute distress and nonill appearing.  Physical exam reassuring.  No lower extremity edema.  Negative Homans sign bilaterally.  Abdomen soft, nondistended, nontender.  Negative Murphy sign.  Anterior chest wall tenderness which does not reproduce patient's chest pain.  Chest pain appears to be atypical for ACS; however, will obtain routine labs, CXR, EKG, and troponin. Will also obtain d-dimer given recent COVID infection and tachycardia during my initial evaluation.   EKG personally reviewed which demonstrates sinus tachycardia with borderline anterior t-wave abnormalities.  CBC reassuring with no leukocytosis.  D-dimer elevated at 1.3. Will obtain CTA to rule out PE. Delta  troponin flat. Doubt ACS. Heart pathway score 2.  Chest x-ray personally reviewed which is negative for signs of pneumonia, pneumothorax, and widened mediastinum. CTA personally reviewed which demonstrates: 1. No demonstrable pulmonary embolus. No thoracic aortic aneurysm or  dissection.    2. Slight bibasilar atelectasis. Lungs elsewhere clear.    3. No  evident adenopathy.    4. Hiatal hernia present.   No concern for PE at this time. Dissection was considered, but thought to be less likely given patient's history and presentation. Discussed results with patient in detail. Instructed patient to follow-up with PCP within the next week to change heart burn medication. Advised patient to take over the counter ibuprofen or tylenol as needed for pain. Strict ED precautions discussed with patient. Patient states understanding and agrees to plan. Patient discharged home in no acute distress and stable vitals.  Final Clinical Impression(s) / ED Diagnoses Final diagnoses:  Nonspecific chest pain    Rx / DC Orders ED Discharge Orders    None       Mannie Stabile, PA-C 08/24/19 1419    Virgina Norfolk, DO 08/24/19 1513

## 2019-08-24 NOTE — ED Triage Notes (Signed)
PT reports Cp for a little while . Pt first thought it was heart burn but then lately he lt arm started to hurt. Today her lt arm is throbbing.

## 2019-08-24 NOTE — Discharge Instructions (Signed)
As discussed, all of your labs and imaging were reassuring today.  I recommend following up with your PCP within the next week for further evaluation of your chest pain.  Your reflux medication may need to be changed around a bit.  You may take over-the-counter ibuprofen or Tylenol as needed for pain.  Return to the ER for new or worsening symptoms.

## 2019-08-24 NOTE — ED Notes (Signed)
Pt returned from CT at this time. Placed pt back on monitor, pt placed in position of comfort and advised of wait status.   

## 2019-08-29 ENCOUNTER — Telehealth: Payer: Self-pay

## 2019-08-29 NOTE — Telephone Encounter (Signed)
Called patient to do their pre-visit COVID screening.  Have you tested positive for COVID or are you currently waiting for COVID test results? no  Have you recently traveled internationally([place], [place], [place], [place], [place]) or within the [place] to a hotspot area([place], [place], [place], [place], [place])? no  Are you currently experiencing any of the following symptoms: fever, cough, SHOB, fatigue, body aches, loss of smell/taste, rash, diarrhea, vomiting, severe headaches, weakness, sore throat? no  Have you been in contact with anyone who has recently travelled? no  Have you been in contact with anyone who is experiencing any of the above symptoms or been diagnosed with COVID  or works in or has recently visited a SNF? no  Did ask patient to come fasting.

## 2019-08-29 NOTE — Patient Instructions (Signed)
Thank you for choosing Primary Care at [facility] to be your medical home!    Suzanne [name] was seen by De Hollingshead, DO today.   Suzanne [name]'s primary care provider is Marcy Siren, DO.   For the best care possible, you should try to see Marcy Siren, DO whenever you come to the clinic.   We look forward to seeing you again soon!  If you have any questions about your visit today, please call us at [phone] or feel free to reach your primary care provider via MyChart.     Keeping You Healthy  Get These Tests 1. Blood Pressure- Have your blood pressure checked once a year by your health care provider.  Normal blood pressure is 120/80. 2. Weight- Have your body mass index (BMI) calculated to screen for obesity.  BMI is measure of body fat based on height and weight.  You can also calculate your own BMI at https://www.west-esparza.com/. 3. Cholesterol- Have your cholesterol checked every 5 years starting at age 48 then yearly starting at age 42. 4. Chlamydia, HIV, and other sexually transmitted diseases- Get screened every year until age 47, then within three months of each new sexual provider. 5. Pap Test - Every 1-5 years; discuss with your health care provider. 6. Mammogram- Every 1-2 years starting at age 45--50  Take these medicines  Calcium with Vitamin D-Your body needs 1200 mg of Calcium each day and [phone] IU of Vitamin D daily.  Your body can only absorb 500 mg of Calcium at a time so Calcium must be taken in 2 or 3 divided doses throughout the day.  Multivitamin with folic acid- Once daily if it is possible for you to become pregnant.  Get these Immunizations  Gardasil-Series of three doses; prevents HPV related illness such as genital warts and cervical cancer.  Menactra-Single dose; prevents meningitis.  Tetanus shot- Every 10 years.  Flu shot-Every year.  Take these steps 1. Do not smoke-Your healthcare provider can help you quit.  For tips on  how to quit go to www.smokefree.gov or call 1-800 QUITNOW. 2. Be physically active- Exercise 5 days a week for at least 30 minutes.  If you are not already physically active, start slow and gradually work up to 30 minutes of moderate physical activity.  Examples of moderate activity include walking briskly, dancing, swimming, bicycling, etc. 3. Breast Cancer- A self breast exam every month is important for early detection of breast cancer.  For more information and instruction on self breast exams, ask your healthcare provider or [place]. 4. Eat a healthy diet- Eat a variety of healthy foods such as fruits, vegetables, whole grains, low fat milk, low fat cheeses, yogurt, lean meats, poultry and fish, beans, nuts, tofu, etc.  For more information go to www. Thenutritionsource.org 5. Drink alcohol in moderation- Limit alcohol intake to one drink or less per day. Never drink and drive. 6. Depression- Your emotional health is as important as your physical health.  If you're feeling down or losing interest in things you normally enjoy please talk to your healthcare provider about being screened for depression. 7. Dental visit- Brush and floss your teeth twice daily; visit your dentist twice a year. 8. Eye doctor- Get an eye exam at least every 2 years. 9. Helmet use- Always wear a helmet when riding a bicycle, motorcycle, rollerblading or skateboarding. 10. Safe sex- If you may be exposed to sexually transmitted infections, use a condom. 11. Seat belts- Seat belts  can save your live; always wear one. 12. Smoke/Carbon Monoxide detectors- These detectors need to be installed on the appropriate level of your home. Replace batteries at least once a year. 13. Skin cancer- When out in the sun please cover up and use sunscreen 15 SPF or higher. 14. Violence- If anyone is threatening or hurting you, please tell your healthcare provider.

## 2019-08-30 ENCOUNTER — Encounter: Payer: Self-pay | Admitting: Internal Medicine

## 2019-08-30 ENCOUNTER — Ambulatory Visit (INDEPENDENT_AMBULATORY_CARE_PROVIDER_SITE_OTHER): Payer: 59 | Admitting: Internal Medicine

## 2019-08-30 ENCOUNTER — Other Ambulatory Visit: Payer: Self-pay

## 2019-08-30 VITALS — BP 116/80 | HR 84 | Temp 97.2°F | Resp 17 | Ht 63.0 in | Wt 239.0 lb

## 2019-08-30 DIAGNOSIS — K219 Gastro-esophageal reflux disease without esophagitis: Secondary | ICD-10-CM

## 2019-08-30 DIAGNOSIS — Z13228 Encounter for screening for other metabolic disorders: Secondary | ICD-10-CM | POA: Diagnosis not present

## 2019-08-30 DIAGNOSIS — Z0000 Encounter for general adult medical examination without abnormal findings: Secondary | ICD-10-CM

## 2019-08-30 DIAGNOSIS — F411 Generalized anxiety disorder: Secondary | ICD-10-CM

## 2019-08-30 MED ORDER — SERTRALINE HCL 25 MG PO TABS: ORAL_TABLET | ORAL | 1 refills | Status: DC

## 2019-08-30 MED ORDER — PANTOPRAZOLE SODIUM 40 MG PO TBEC: 40.0000 mg | DELAYED_RELEASE_TABLET | Freq: Every day | ORAL | 3 refills | Status: DC

## 2019-08-30 NOTE — Progress Notes (Addendum)
Subjective:    Suzanne [name] - 29 y.o. female MRN [id number]  Date of birth: [birth year]  HPI  RIO TABER is here for annual exam. She has concerns about anxiety. Constant state of worrying. 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 Then, her husband and her had COVID in Jan. Her husband has noticed that she becomes more anxious as well over the past few months and encouraged her to speak to a doctor. She has never taken medications or been to counseling in past but she wishes to try both. She is also suffering from GERD. This has been a thing that has always bothered her but recently got worse where she was taking Prilosec and Tums prn without relief. She ended up going to the ER for these symptoms. Feels like her anxiety made her worry that she was having cardiac chest pain. Her work up was unremarkable.   GAD 7 : Generalized Anxiety Score 08/30/2019 08/03/2018  Nervous, Anxious, on Edge 3 1  Control/stop worrying 3 1  Worry too much - different things 3 0  Trouble relaxing 3 1  Restless 1 0  Easily annoyed or irritable 3 1  Afraid - awful might happen 3 0  Total GAD 7 Score 19 4  Anxiety Difficulty Very difficult -       Health Maintenance:  Health Maintenance Due  Topic Date Due  . INFLUENZA VACCINE  01/29/2019    -  reports that she has never smoked. She has never used smokeless tobacco. - Review of Systems: Per HPI. - Past Medical History: Patient Active Problem List   Diagnosis Date Noted  . Obesity 07/23/2018   - Medications: reviewed and updated   Objective:   Physical Exam Temp (!) 97.2 F (36.2 C) (Temporal)   Resp 17   Wt 239 lb (108.4 kg)   LMP 08/25/2019 (Exact Date)   BMI 42.34 kg/m  Physical Exam  Constitutional: She is oriented to person, place, and time and well-developed, well-nourished, and in no distress.  HENT:  Head: Normocephalic and atraumatic.  Mouth/Throat: Oropharynx is clear and moist.  Eyes: Pupils are equal, round, and  reactive to light. Conjunctivae and EOM are normal.  Neck: No thyromegaly present.  Cardiovascular: Normal rate, regular rhythm, normal heart sounds and intact distal pulses.  No murmur heard. Pulmonary/Chest: Effort normal and breath sounds normal. No respiratory distress. She has no wheezes.  Breast exam: Breasts symmetric. No erythema/rashes/skin color changes/skin texture changes/nipple discharge. No discrete palpable masses appreciated in axilla or 4 quadrants of the breast bilaterally. Breasts feel dense in upper quadrants especially bilaterally.   Abdominal: Soft. Bowel sounds are normal. She exhibits no distension. There is no abdominal tenderness. There is no rebound and no guarding.  Musculoskeletal:        General: No deformity or edema. Normal range of motion.     Cervical back: Normal range of motion and neck supple.  Lymphadenopathy:    She has no cervical adenopathy.  Neurological: She is alert and oriented to person, place, and time. Gait normal.  Skin: Skin is warm and dry. No rash noted. She is not diaphoretic.  Psychiatric: Mood, affect and judgment normal.           Assessment & Plan:   1. Annual physical exam - CBC with Differential - Comprehensive metabolic panel - Lipid Panel  2. Screening for metabolic disorder - CBC with Differential - Comprehensive metabolic panel - Lipid Panel  3. Generalized  anxiety disorder Will start patient on Zoloft and have her scheduled with Jasmine, LCSW. Will plan for mood follow up visit in 4-6 weeks. Will also check some labs to ensure no other abnormalities effecting mood.  - TSH - Vitamin B12 - Vitamin D, 25-hydroxy - sertraline (ZOLOFT) 25 MG tablet; Take one tablet daily. If tolerating, increase to two tablets in one week.  Dispense: 60 tablet; Refill: 1  4. Gastroesophageal reflux disease, unspecified whether esophagitis present Patient was found to have hiatal hernia on imaging on 2/24 which could be worsening her  GERD. Trial of continuous PPI for the next 8-12 weeks. May eventually benefit from GI referral.  - pantoprazole (PROTONIX) 40 MG tablet; Take 1 tablet (40 mg total) by mouth daily.  Dispense: 30 tablet; Refill: [place], D.O. 08/30/2019, 8:41 AM Primary Care at [facility]

## 2019-08-31 ENCOUNTER — Other Ambulatory Visit: Payer: Self-pay | Admitting: Internal Medicine

## 2019-08-31 DIAGNOSIS — E559 Vitamin D deficiency, unspecified: Secondary | ICD-10-CM

## 2019-08-31 LAB — COMPREHENSIVE METABOLIC PANEL
ALT: 12 IU/L (ref 0–32)
AST: 14 IU/L (ref 0–40)
Albumin/Globulin Ratio: 1.4 (ref 1.2–2.2)
Albumin: 4.3 g/dL (ref 3.9–5.0)
Alkaline Phosphatase: 86 IU/L (ref 39–117)
BUN/Creatinine Ratio: 10 (ref 9–23)
BUN: 8 mg/dL (ref 6–20)
Bilirubin Total: 0.4 mg/dL (ref 0.0–1.2)
CO2: 23 mmol/L (ref 20–29)
Calcium: 9.5 mg/dL (ref 8.7–10.2)
Chloride: 99 mmol/L (ref 96–106)
Creatinine, Ser: 0.78 mg/dL (ref 0.57–1.00)
GFR calc Af Amer: 118 mL/min/{1.73_m2} (ref >59)
GFR calc non Af Amer: 102 mL/min/{1.73_m2} (ref >59)
Globulin, Total: 3 g/dL (ref 1.5–4.5)
Glucose: 85 mg/dL (ref 65–99)
Potassium: 3.9 mmol/L (ref 3.5–5.2)
Sodium: 136 mmol/L (ref 134–144)
Total Protein: 7.3 g/dL (ref 6.0–8.5)

## 2019-08-31 LAB — CBC WITH DIFFERENTIAL/PLATELET
Basophils Absolute: 0 10*3/uL (ref 0.0–0.2)
Basos: 1 %
EOS (ABSOLUTE): 0.4 10*3/uL (ref 0.0–0.4)
Eos: 9 %
Hematocrit: 35.9 % (ref 34.0–46.6)
Hemoglobin: 11.6 g/dL (ref 11.1–15.9)
Immature Grans (Abs): 0 10*3/uL (ref 0.0–0.1)
Immature Granulocytes: 0 %
Lymphocytes Absolute: 1.3 10*3/uL (ref 0.7–3.1)
Lymphs: 26 %
MCH: 25.1 pg — ABNORMAL LOW (ref 26.6–33.0)
MCHC: 32.3 g/dL (ref 31.5–35.7)
MCV: 78 fL — ABNORMAL LOW (ref 79–97)
Monocytes Absolute: 0.4 10*3/uL (ref 0.1–0.9)
Monocytes: 8 %
Neutrophils Absolute: 2.9 10*3/uL (ref 1.4–7.0)
Neutrophils: 56 %
Platelets: 359 10*3/uL (ref 150–450)
RBC: 4.62 x10E6/uL (ref 3.77–5.28)
RDW: 15.2 % (ref 11.7–15.4)
WBC: 5.1 10*3/uL (ref 3.4–10.8)

## 2019-08-31 LAB — VITAMIN D 25 HYDROXY (VIT D DEFICIENCY, FRACTURES): Vit D, 25-Hydroxy: 22.3 ng/mL — ABNORMAL LOW (ref 30.0–100.0)

## 2019-08-31 LAB — VITAMIN B12: Vitamin B-12: 643 pg/mL (ref 232–1245)

## 2019-08-31 LAB — TSH: TSH: 2.95 u[IU]/mL (ref 0.450–4.500)

## 2019-08-31 LAB — LIPID PANEL
Chol/HDL Ratio: 3.3 ratio (ref 0.0–4.4)
Cholesterol, Total: 185 mg/dL (ref 100–199)
HDL: 56 mg/dL (ref >39)
LDL Chol Calc (NIH): 119 mg/dL — ABNORMAL HIGH (ref 0–99)
Triglycerides: 53 mg/dL (ref 0–149)
VLDL Cholesterol Cal: 10 mg/dL (ref 5–40)

## 2019-08-31 MED ORDER — VITAMIN D (ERGOCALCIFEROL) 1.25 MG (50000 UNIT) PO CAPS: 50000.0000 [IU] | ORAL_CAPSULE | ORAL | 0 refills | Status: DC

## 2019-09-01 ENCOUNTER — Other Ambulatory Visit: Payer: Self-pay

## 2019-09-01 DIAGNOSIS — E559 Vitamin D deficiency, unspecified: Secondary | ICD-10-CM

## 2019-09-01 MED ORDER — VITAMIN D (ERGOCALCIFEROL) 1.25 MG (50000 UNIT) PO CAPS: 50000.0000 [IU] | ORAL_CAPSULE | ORAL | 0 refills | Status: DC

## 2019-09-01 NOTE — Progress Notes (Signed)
Patient notified of results & recommendations. Expressed understanding.

## 2019-09-06 ENCOUNTER — Institutional Professional Consult (permissible substitution): Payer: 59 | Admitting: Licensed Clinical Social Worker

## 2019-10-11 ENCOUNTER — Telehealth (INDEPENDENT_AMBULATORY_CARE_PROVIDER_SITE_OTHER): Payer: 59 | Admitting: Internal Medicine

## 2019-10-11 ENCOUNTER — Encounter: Payer: Self-pay | Admitting: Internal Medicine

## 2019-10-11 DIAGNOSIS — R300 Dysuria: Secondary | ICD-10-CM | POA: Diagnosis not present

## 2019-10-11 DIAGNOSIS — F411 Generalized anxiety disorder: Secondary | ICD-10-CM | POA: Diagnosis not present

## 2019-10-11 DIAGNOSIS — E559 Vitamin D deficiency, unspecified: Secondary | ICD-10-CM | POA: Insufficient documentation

## 2019-10-11 DIAGNOSIS — K219 Gastro-esophageal reflux disease without esophagitis: Secondary | ICD-10-CM | POA: Diagnosis not present

## 2019-10-11 MED ORDER — SERTRALINE HCL 50 MG PO TABS: 50.0000 mg | ORAL_TABLET | Freq: Every day | ORAL | 1 refills | Status: DC

## 2019-10-11 NOTE — Progress Notes (Signed)
Virtual Visit via Telephone Note  I connected with Suzanne [name], on 10/11/2019 at 9:11 AM by telephone due to the COVID-19 pandemic and verified that I am speaking with the correct person using two identifiers.   Consent: I discussed the limitations, risks, security and privacy concerns of performing an evaluation and management service by telephone and the availability of in person appointments. I also discussed with the patient that there may be a patient responsible charge related to this service. The patient expressed understanding and agreed to proceed.   Location of Patient: Home   Location of Provider: Clinic    Persons participating in Telemedicine visit: Mirella Gueye [facility] [address]. Earlene Plater      History of Present Illness: Patient has a visit to f/u anxiety. Patient was seen on 3/2 and started on Zoloft. She is currently taking Zoloft 50 mg. Has noticed a big improvement in her mood and degree of worrying. She was not able to touch base with [facility] and will need to reschedule the appointment. No side effects from Zoloft.    Has concerns about UTI vs. Yeast infection. She is having dysuria but she isn't sure if that's related to yeast infection. In past she has not had burning with yeast infections. She denies frequency or urgency. She is having some lower back pain. She has never had a UTI. She used Monistat for presumed yeast infection a couple years ago. She is currently having clear vaginal discharge.   GAD 7 : Generalized Anxiety Score 10/11/2019 08/30/2019 08/03/2018  Nervous, Anxious, on Edge 1 3 1   Control/stop worrying 0 3 1  Worry too much - different things 1 3 0  Trouble relaxing 1 3 1   Restless 0 1 0  Easily annoyed or irritable 0 3 1  Afraid - awful might happen 0 3 0  Total GAD 7 Score 3 19 4   Anxiety Difficulty - Very difficult -      Past Medical History:  Diagnosis Date  . Obesity    No Known Allergies  Current Outpatient Medications on File  Prior to Visit  Medication Sig Dispense Refill  . pantoprazole (PROTONIX) 40 MG tablet Take 1 tablet (40 mg total) by mouth daily. 30 tablet 3  . sertraline (ZOLOFT) 25 MG tablet Take one tablet daily. If tolerating, increase to two tablets in one week. 60 tablet 1  . Vitamin D, Ergocalciferol, (DRISDOL) 1.25 MG (50000 UNIT) CAPS capsule Take 1 capsule (50,000 Units total) by mouth every 7 (seven) days. 4 capsule 0   No current facility-administered medications on file prior to visit.    Observations/Objective: NAD. Speaking clearly.  Work of breathing normal.  Alert and oriented. Mood appropriate.   Assessment and Plan: 1. Generalized anxiety disorder GAD-7 score significantly improved from 19 to 3 with initiation of Zoloft. Will send in 50 mg tablet so that patient only has to take one tablet daily. Continue trial of medication for at least 6 months. Patient to let me know if she has concerns about mood, side effects, etc while taking Zoloft. Reschedule visit with LCSW.  - sertraline (ZOLOFT) 50 MG tablet; Take 1 tablet (50 mg total) by mouth daily. Take one tablet daily. If tolerating, increase to two tablets in one week.  Dispense: 90 tablet; Refill: 1  2. Vitamin D deficiency Found to have mild Vit D deficiency with result of 22.3 on 08/30/19. Completed 4 week course of IU weekly. She is now taking OTC Vit D supplement.  Will plan to repeat labs in next 2-3 months.   3. Gastroesophageal reflux disease, unspecified whether esophagitis present Reports symptoms have resolved with PPI. Patient does have hiatal hernia. If symptoms return once trial off PPI, would recommend GI f/u.   4. Dysuria Will have patient come in to obtain vaginal swab, UA, and urine culture. Her symptoms could be concerning for cystitis vs. Vaginitis.  - Urinalysis, Routine w reflex microscopic; Future - Urine Culture; Future - Cervicovaginal ancillary only; Future   Follow Up Instructions: Lab visit 4/14     I discussed the assessment and treatment plan with the patient. The patient was provided an opportunity to ask questions and all were answered. The patient agreed with the plan and demonstrated an understanding of the instructions.   The patient was advised to call back or seek an in-person evaluation if the symptoms worsen or if the condition fails to improve as anticipated.     I provided 14 minutes total of non-face-to-face time during this encounter including median intraservice time, reviewing previous notes, investigations, ordering medications, medical decision making, coordinating care and patient verbalized understanding at the end of the visit.    Phill Myron, D.O. Primary Care at [facility]  10/11/2019, 9:11 AM

## 2019-10-12 ENCOUNTER — Other Ambulatory Visit: Payer: Self-pay

## 2019-10-12 ENCOUNTER — Other Ambulatory Visit (INDEPENDENT_AMBULATORY_CARE_PROVIDER_SITE_OTHER): Payer: 59

## 2019-10-12 ENCOUNTER — Other Ambulatory Visit (HOSPITAL_COMMUNITY)
Admission: RE | Admit: 2019-10-12 | Discharge: 2019-10-12 | Disposition: A | Discharge status: Home / Self Care | Payer: 59 | Source: Ambulatory Visit | Attending: Internal Medicine | Admitting: Internal Medicine

## 2019-10-12 DIAGNOSIS — R300 Dysuria: Secondary | ICD-10-CM | POA: Diagnosis not present

## 2019-10-13 ENCOUNTER — Other Ambulatory Visit: Payer: Self-pay | Admitting: Internal Medicine

## 2019-10-13 LAB — CERVICOVAGINAL ANCILLARY ONLY
Bacterial Vaginitis (gardnerella): NEGATIVE
Candida Glabrata: NEGATIVE
Candida Vaginitis: POSITIVE — AB
Chlamydia: NEGATIVE
Comment: NEGATIVE
Comment: NEGATIVE
Comment: NEGATIVE
Comment: NEGATIVE
Comment: NEGATIVE
Comment: NORMAL
Neisseria Gonorrhea: NEGATIVE
Trichomonas: NEGATIVE

## 2019-10-13 LAB — URINALYSIS, ROUTINE W REFLEX MICROSCOPIC
Bilirubin, UA: NEGATIVE
Glucose, UA: NEGATIVE
Ketones, UA: NEGATIVE
Leukocytes,UA: NEGATIVE
Nitrite, UA: NEGATIVE
Protein,UA: NEGATIVE
RBC, UA: NEGATIVE
Specific Gravity, UA: 1.02 (ref 1.005–1.030)
Urobilinogen, Ur: 0.2 mg/dL (ref 0.2–1.0)
pH, UA: 5 (ref 5.0–7.5)

## 2019-10-13 LAB — URINE CULTURE

## 2019-10-13 MED ORDER — FLUCONAZOLE 150 MG PO TABS: ORAL_TABLET | ORAL | 0 refills | Status: DC

## 2019-10-13 NOTE — Progress Notes (Signed)
Patient notified of results & recommendations. Expressed understanding.

## 2019-10-14 NOTE — Progress Notes (Signed)
Patient notified of results & recommendations. Expressed understanding.

## 2019-11-04 ENCOUNTER — Ambulatory Visit: Payer: 59

## 2020-06-25 ENCOUNTER — Ambulatory Visit: Payer: 59 | Attending: Internal Medicine

## 2020-06-25 ENCOUNTER — Other Ambulatory Visit: Payer: Self-pay

## 2020-06-25 DIAGNOSIS — Z23 Encounter for immunization: Secondary | ICD-10-CM

## 2020-06-25 NOTE — Progress Notes (Signed)
   Covid-19 Vaccination Clinic  Name:  Suzanne [name]    MRN: [id number] DOB: 11/02/1990  06/25/2020  [name] was observed post Covid-19 immunization for 15 minutes without incident. She was provided with Vaccine Information Sheet and instruction to access the V-Safe system.   [name] was instructed to call 911 with any severe reactions post vaccine: Marland Kitchen Difficulty breathing  . Swelling of face and throat  . A fast heartbeat  . A bad rash all over body  . Dizziness and weakness   Immunizations Administered    Name Date Dose VIS Date Route   Pfizer COVID-19 Vaccine 06/25/2020  1:38 PM 0.3 mL 04/18/2020 Intramuscular   Manufacturer: [employer], [employer]   Lot: [id number]   NDC: [address]-4

## 2020-07-13 DIAGNOSIS — Z20822 Contact with and (suspected) exposure to covid-19: Secondary | ICD-10-CM | POA: Diagnosis not present

## 2020-09-29 ENCOUNTER — Other Ambulatory Visit: Payer: Self-pay

## 2020-09-29 ENCOUNTER — Emergency Department (HOSPITAL_COMMUNITY): Payer: 59

## 2020-09-29 ENCOUNTER — Emergency Department (HOSPITAL_COMMUNITY)
Admission: EM | Admit: 2020-09-29 | Discharge: 2020-09-29 | Disposition: A | Discharge status: Home / Self Care | Payer: 59 | Attending: Emergency Medicine | Admitting: Emergency Medicine

## 2020-09-29 ENCOUNTER — Encounter (HOSPITAL_COMMUNITY): Payer: Self-pay | Admitting: *Deleted

## 2020-09-29 DIAGNOSIS — K8020 Calculus of gallbladder without cholecystitis without obstruction: Secondary | ICD-10-CM

## 2020-09-29 DIAGNOSIS — R109 Unspecified abdominal pain: Secondary | ICD-10-CM

## 2020-09-29 DIAGNOSIS — R110 Nausea: Secondary | ICD-10-CM | POA: Diagnosis not present

## 2020-09-29 DIAGNOSIS — R10811 Right upper quadrant abdominal tenderness: Secondary | ICD-10-CM | POA: Diagnosis not present

## 2020-09-29 DIAGNOSIS — R1011 Right upper quadrant pain: Secondary | ICD-10-CM | POA: Diagnosis not present

## 2020-09-29 LAB — CBC WITH DIFFERENTIAL/PLATELET
Abs Immature Granulocytes: 0.03 10*3/uL (ref 0.00–0.07)
Basophils Absolute: 0.1 10*3/uL (ref 0.0–0.1)
Basophils Relative: 1 %
Eosinophils Absolute: 0.2 10*3/uL (ref 0.0–0.5)
Eosinophils Relative: 3 %
HCT: 35.8 % — ABNORMAL LOW (ref 36.0–46.0)
Hemoglobin: 11.3 g/dL — ABNORMAL LOW (ref 12.0–15.0)
Immature Granulocytes: 0 %
Lymphocytes Relative: 19 %
Lymphs Abs: 1.5 10*3/uL (ref 0.7–4.0)
MCH: 24.7 pg — ABNORMAL LOW (ref 26.0–34.0)
MCHC: 31.6 g/dL (ref 30.0–36.0)
MCV: 78.3 fL — ABNORMAL LOW (ref 80.0–100.0)
Monocytes Absolute: 0.5 10*3/uL (ref 0.1–1.0)
Monocytes Relative: 6 %
Neutro Abs: 5.8 10*3/uL (ref 1.7–7.7)
Neutrophils Relative %: 71 %
Platelets: 337 10*3/uL (ref 150–400)
RBC: 4.57 MIL/uL (ref 3.87–5.11)
RDW: 15.9 % — ABNORMAL HIGH (ref 11.5–15.5)
WBC: 8.1 10*3/uL (ref 4.0–10.5)
nRBC: 0 % (ref 0.0–0.2)

## 2020-09-29 LAB — URINALYSIS, ROUTINE W REFLEX MICROSCOPIC
Bacteria, UA: NONE SEEN
Bilirubin Urine: NEGATIVE
Glucose, UA: NEGATIVE mg/dL
Hgb urine dipstick: NEGATIVE
Ketones, ur: 80 mg/dL — AB
Nitrite: NEGATIVE
Protein, ur: 30 mg/dL — AB
Specific Gravity, Urine: 1.027 (ref 1.005–1.030)
pH: 6 (ref 5.0–8.0)

## 2020-09-29 LAB — COMPREHENSIVE METABOLIC PANEL
ALT: 17 U/L (ref 0–44)
AST: 14 U/L — ABNORMAL LOW (ref 15–41)
Albumin: 3.6 g/dL (ref 3.5–5.0)
Alkaline Phosphatase: 68 U/L (ref 38–126)
Anion gap: 8 (ref 5–15)
BUN: 13 mg/dL (ref 6–20)
CO2: 23 mmol/L (ref 22–32)
Calcium: 9.2 mg/dL (ref 8.9–10.3)
Chloride: 102 mmol/L (ref 98–111)
Creatinine, Ser: 0.8 mg/dL (ref 0.44–1.00)
GFR, Estimated: >60 mL/min (ref >60)
Glucose, Bld: 116 mg/dL — ABNORMAL HIGH (ref 70–99)
Potassium: 4.1 mmol/L (ref 3.5–5.1)
Sodium: 133 mmol/L — ABNORMAL LOW (ref 135–145)
Total Bilirubin: 0.4 mg/dL (ref 0.3–1.2)
Total Protein: 7.2 g/dL (ref 6.5–8.1)

## 2020-09-29 LAB — I-STAT BETA HCG BLOOD, ED (MC, WL, AP ONLY): I-stat hCG, quantitative: <5 m[IU]/mL (ref <5)

## 2020-09-29 LAB — LIPASE, BLOOD: Lipase: 25 U/L (ref 11–51)

## 2020-09-29 MED ORDER — OXYCODONE-ACETAMINOPHEN 5-325 MG PO TABS
1.0000 | ORAL_TABLET | Freq: Once | ORAL | Status: AC
  Administered 2020-09-29: 1 via ORAL
  Filled 2020-09-29: qty 1

## 2020-09-29 MED ORDER — ONDANSETRON HCL 4 MG/2ML IJ SOLN
4.0000 mg | Freq: Once | INTRAMUSCULAR | Status: AC
  Administered 2020-09-29: 4 mg via INTRAVENOUS
  Filled 2020-09-29: qty 2

## 2020-09-29 MED ORDER — NAPROXEN 500 MG PO TABS: 500.0000 mg | ORAL_TABLET | Freq: Two times a day (BID) | ORAL | 0 refills | Status: DC

## 2020-09-29 MED ORDER — FENTANYL CITRATE (PF) 100 MCG/2ML IJ SOLN
50.0000 ug | Freq: Once | INTRAMUSCULAR | Status: AC
  Administered 2020-09-29: 50 ug via INTRAVENOUS
  Filled 2020-09-29: qty 2

## 2020-09-29 MED ORDER — LACTATED RINGERS IV BOLUS
1000.0000 mL | Freq: Once | INTRAVENOUS | Status: AC
  Administered 2020-09-29: 1000 mL via INTRAVENOUS

## 2020-09-29 MED ORDER — ONDANSETRON 4 MG PO TBDP
4.0000 mg | ORAL_TABLET | Freq: Once | ORAL | Status: AC
  Administered 2020-09-29: 4 mg via ORAL
  Filled 2020-09-29: qty 1

## 2020-09-29 MED ORDER — SODIUM CHLORIDE 0.9 % IV BOLUS
500.0000 mL | Freq: Once | INTRAVENOUS | Status: AC
  Administered 2020-09-29: 500 mL via INTRAVENOUS

## 2020-09-29 NOTE — ED Provider Notes (Signed)
MSE was initiated and I personally evaluated the patient and placed orders (if any) at  5:50 AM on September 29, 2020.  Patient with RUQ abdominal pain.  Onset tonight at 1am.  Associated nausea.  Hx of gallstones.  Discussed with patient that their care has been initiated.   They are counseled that they will need to remain in the ED until the completion of their workup, including full H&P and results of any tests.  Risks of leaving the emergency department prior to completion of treatment were discussed. Patient was advised to inform ED staff if they are leaving before their treatment is complete. The patient acknowledged these risks and time was allowed for questions.    The patient appears stable so that the remainder of the MSE may be completed by another provider.    Roxy Horseman, PA-C 09/29/20 1859    Zadie Rhine, MD 09/30/20 [phone]

## 2020-09-29 NOTE — ED Notes (Signed)
Pt remains in lobby waiting.  Updated on wait for treatment room and pain medication given.

## 2020-09-29 NOTE — ED Provider Notes (Signed)
Patient is a 30 year old female who presents with right-sided abdominal pain.  I received patient and provider handoff at 3:30 PM.  Patient has had right upper quadrant ultrasound which revealed gallstones but no signs of cholecystitis or other abnormalities.  Right kidney appeared normal on ultrasound.  Labs within normal limits.  Patient is awaiting urine studies and depending on results of that will disposition patient.  Based off of previous providers examination and history, low suspicion for appendicitis, ovarian torsion, or other pelvic pathology as patient has only upper right abdominal pain.  Physical Exam  BP 119/78   Pulse 97   Temp 98.3 F (36.8 C) (Oral)   Resp 15   Ht 5\' 3"  (1.6 m)   Wt 117.9 kg   SpO2 99%   BMI 46.06 kg/m   Physical Exam Vitals and nursing note reviewed.  Constitutional:      Appearance: She is well-developed.  HENT:     Head: Normocephalic and atraumatic.  Eyes:     Conjunctiva/sclera: Conjunctivae normal.  Cardiovascular:     Rate and Rhythm: Normal rate.     Heart sounds: Normal heart sounds.  Pulmonary:     Effort: Pulmonary effort is normal. No respiratory distress.  Abdominal:     Palpations: Abdomen is soft.     Tenderness: There is abdominal tenderness. There is right CVA tenderness.  Musculoskeletal:     Cervical back: Neck supple.  Skin:    General: Skin is warm and dry.  Neurological:     Mental Status: She is alert.  Psychiatric:        Mood and Affect: Mood normal.        Behavior: Behavior normal.     ED Course/Procedures     Procedures  MDM  Urine shows no evidence for infection and no blood.  Low suspicion for symptomatic kidney stone at this time.  No evidence for pyelonephritis or urinary tract infection.  Discussed these results with patient at bedside.  Reviewed all of her results.  Symptoms today most likely secondary to symptomatic cholelithiasis.  Will refer her to general surgery for possible cholecystectomy in  the outpatient setting her symptoms continue.  Encourage naproxen and Tylenol for pain control.  Stable for discharge home at this time.       Areli Frary, , MD 09/29/20 2310    11/29/20, MD 09/29/20 [phone]

## 2020-09-29 NOTE — Discharge Instructions (Addendum)
Please call and schedule an appointment with the surgery center.  It is likely you will need your gallbladder out.  Your lab work looked reassuring today.   Your Ultrasound did show gallstones but no evidence of infection.   Return to the Emergency Department immediately if you experience any worsening abdominal pain, fever, persistent nausea and vomiting, inability keep any food down, pain with urination, blood in your urine or any other worsening or concerning symptoms.

## 2020-09-29 NOTE — ED Provider Notes (Signed)
[facility] EMERGENCY DEPARTMENT Provider Note   CSN: [id number] Arrival date & time: 09/29/20  5400     History Chief Complaint  Patient presents with  . Abdominal Pain    Suzanne [name] is a 30 y.o. female with PMH/o GERD, Vitamin D who presents for evaluation of abdominal pain that began last night.  Patient reports she woke up acutely at 1 AM with pain right mid abdomen that radiates to her back.  She states that she had some nausea but no vomiting.  She states she was fine prior to onset of symptoms.  She has not had any fever, nausea/vomiting/diarrhea.  She states that she tried taking ibuprofen with minimal improvement in her symptoms, prompting ED visit.  She states she has not had any dysuria, hematuria.  She denies any fevers, chest pain, difficulty breathing.  She reports she has a history of gallstones.  She states that they were found when she was pregnant.  She has not had any issues with them since then.  The history is provided by the patient.       Past Medical History:  Diagnosis Date  . Obesity     Patient Active Problem List   Diagnosis Date Noted  . Gastroesophageal reflux disease 10/11/2019  . Generalized anxiety disorder 10/11/2019  . Vitamin D deficiency 10/11/2019  . Obesity 07/23/2018    Past Surgical History:  Procedure Laterality Date  . NO PAST SURGERIES    . VAGINAL DELIVERY N/A 10/24/2016   Procedure: VAGINAL DELIVERY;  Surgeon: Lavina Hamman, MD;  Location: [facility] BIRTHING SUITES;  Service: Obstetrics;  Laterality: N/A;     OB History    Gravida  3   Para  3   Term  3   Preterm  0   AB  0   Living  6     SAB  0   IAB  0   Ectopic  0   Multiple  3   Live Births  6           Family History  Problem Relation Age of Onset  . Varicose Veins Mother   . Diabetes Father   . Healthy Child   . Healthy Child   . Healthy Child   . Healthy Child     Social History   Tobacco Use  . Smoking status: Never  Smoker  . Smokeless tobacco: Never Used  Substance Use Topics  . Alcohol use: No  . Drug use: No    Home Medications Prior to Admission medications   Medication Sig Start Date End Date Taking? Authorizing Provider  fluconazole (DIFLUCAN) 150 MG tablet Take one tablet now. Take second tablet in 72 hours if still feeling symptoms. 10/13/19   Arvilla Market, DO  pantoprazole (PROTONIX) 40 MG tablet Take 1 tablet (40 mg total) by mouth daily. 08/30/19   Arvilla Market, DO  sertraline (ZOLOFT) 50 MG tablet Take 1 tablet (50 mg total) by mouth daily. Take one tablet daily. If tolerating, increase to two tablets in one week. 10/11/19   Arvilla Market, DO    Allergies    Patient has no known allergies.  Review of Systems   Review of Systems  Constitutional: Negative for fever.  Respiratory: Negative for cough and shortness of breath.   Cardiovascular: Negative for chest pain.  Gastrointestinal: Positive for abdominal pain and nausea. Negative for vomiting.  Genitourinary: Negative for dysuria and hematuria.  Neurological: Negative for headaches.  All other systems reviewed and are negative.   Physical Exam Updated Vital Signs BP 119/78   Pulse 97   Temp 98.3 F (36.8 C) (Oral)   Resp 15   Ht 5\' 3"  (1.6 m)   Wt 117.9 kg   SpO2 99%   BMI 46.06 kg/m   Physical Exam Vitals and nursing note reviewed.  Constitutional:      Appearance: Normal appearance. She is well-developed.     Comments: NAD  HENT:     Head: Normocephalic and atraumatic.  Eyes:     General: Lids are normal.     Conjunctiva/sclera: Conjunctivae normal.     Pupils: Pupils are equal, round, and reactive to light.  Cardiovascular:     Rate and Rhythm: Normal rate and regular rhythm.     Pulses: Normal pulses.     Heart sounds: Normal heart sounds. No murmur heard. No friction rub. No gallop.   Pulmonary:     Effort: Pulmonary effort is normal.     Breath sounds: Normal breath  sounds.     Comments: Lungs clear to auscultation bilaterally.  Symmetric chest rise.  No wheezing, rales, rhonchi. Abdominal:     Palpations: Abdomen is soft. Abdomen is not rigid.     Tenderness: There is no abdominal tenderness. There is right CVA tenderness. There is no guarding.     Comments: Tenderness palpation noted to the mid right abdomen, right upper quadrant.  No rigidity, guarding.  Abdomen is soft, nondistended.  Mild right-sided CVA tenderness.  No focal tenderness noted at Burney's point.  Musculoskeletal:        General: Normal range of motion.     Cervical back: Full passive range of motion without pain.  Skin:    General: Skin is warm and dry.     Capillary Refill: Capillary refill takes less than 2 seconds.  Neurological:     Mental Status: She is alert and oriented to person, place, and time.  Psychiatric:        Speech: Speech normal.     ED Results / Procedures / Treatments   Labs (all labs ordered are listed, but only abnormal results are displayed) Labs Reviewed  CBC WITH DIFFERENTIAL/PLATELET - Abnormal; Notable for the following components:      Result Value   Hemoglobin 11.3 (*)    HCT 35.8 (*)    MCV 78.3 (*)    MCH 24.7 (*)    RDW 15.9 (*)    All other components within normal limits  COMPREHENSIVE METABOLIC PANEL - Abnormal; Notable for the following components:   Sodium 133 (*)    Glucose, Bld 116 (*)    AST 14 (*)    All other components within normal limits  LIPASE, BLOOD  URINALYSIS, ROUTINE W REFLEX MICROSCOPIC  I-STAT BETA HCG BLOOD, ED ([facility], [facility], [facility] ONLY)    EKG None  Radiology Abdomen Complete  Result Date: 09/29/2020 CLINICAL DATA:  Abdominal pain EXAM: ABDOMEN ULTRASOUND COMPLETE COMPARISON:  None. FINDINGS: Gallbladder: Numerous small and layering gallstones. No wall thickening or focal tenderness Common bile duct: Diameter: 3-4 mm Liver: No focal lesion identified. Within normal limits in parenchymal echogenicity. Portal vein  is patent on color Doppler imaging with normal direction of blood flow towards the liver. IVC: No abnormality visualized. Pancreas: Visualized portion unremarkable. Spleen: Size and appearance within normal limits. Right Kidney: Length: 12 cm. Echogenicity within normal limits. No mass or hydronephrosis visualized. Left Kidney: Length: 11 cm. Echogenicity within  normal limits. No mass or hydronephrosis visualized. Abdominal aorta: No aneurysm visualized. IMPRESSION: Numerous small gallstones.  No evidence of acute cholecystitis. Electronically Signed   By: Marnee Spring M.D.   On: 09/29/2020 07:57    Procedures Procedures   Medications Ordered in ED Medications  oxyCODONE-acetaminophen (PERCOCET/ROXICET) 5-325 MG per tablet 1 tablet (1 tablet Oral Given 09/29/20 1303)  ondansetron (ZOFRAN-ODT) disintegrating tablet 4 mg (4 mg Oral Given 09/29/20 1305)  ondansetron (ZOFRAN) injection 4 mg (4 mg Intravenous Given 09/29/20 1545)  fentaNYL (SUBLIMAZE) injection 50 mcg (50 mcg Intravenous Given 09/29/20 1545)  sodium chloride 0.9 % bolus 500 mL (500 mLs Intravenous New Bag/Given 09/29/20 1544)    ED Course  I have reviewed the triage vital signs and the nursing notes.  Pertinent labs & imaging results that were available during my care of the patient were reviewed by me and considered in my medical decision making (see chart for details).    MDM Rules/Calculators/A&P                          30 year old female who presents for evaluation of right-sided abdominal pain, nausea.  No fevers, vomiting.  Reports this started acutely at about 1 AM.  No urinary complaints, previous vomiting, diarrhea.  She was in her normal state of health.  On initial arrival, she is afebrile, nontoxic-appearing.  Vital signs are stable.  On exam, she has some tenderness into the mid right abdomen, right upper quadrant as well as some right-sided CVA tenderness.  Consider hepatobiliary etiology versus viral illness versus  foodborne illness.  Low suspicion for appendicitis.  She has no tenderness in McBurney's point.  Also consider kidney stone given sudden onset of pain.  Labs ordered at triage.  CMP shows normal BUN, Cr. LFTs within normal limits. CBC shows no leukocytosis. Hgb is stable. Lipase is normal. I-stat beta is negative.   U/S shows evidence of gallstones. No acute cholecystitis.  Kidneys appear normal on U/S.   Patient still having some pain has some CVA tenderness.  She did not get a urine ordered.  We will plan to get a urine.  If there is any concern for hemoglobin, infection, would recommend getting a CT renal study to ensure that there is not a stone that was missed on ultrasound.  Patient signed out to Dr. Reuben Likes pending Urine.   Portions of this note were generated with Scientist, clinical (histocompatibility and immunogenetics). Dictation errors may occur despite best attempts at proofreading.   Final Clinical Impression(s) / ED Diagnoses Final diagnoses:  Abdominal pain, unspecified abdominal location    Rx / DC Orders ED Discharge Orders    None       Rosana Hoes 09/29/20 1552    Gwyneth Sprout, MD 09/29/20 2127

## 2020-09-29 NOTE — ED Triage Notes (Signed)
States she woke     Up this am with c/o lower abd. Ain with radiation into her back . States she took 2 tylenol without relief, states she knows she has gallstones , c/o vomiting. X 2

## 2020-10-03 ENCOUNTER — Ambulatory Visit: Payer: 59 | Admitting: Family

## 2020-10-03 ENCOUNTER — Other Ambulatory Visit: Payer: Self-pay

## 2020-10-03 ENCOUNTER — Encounter: Payer: Self-pay | Admitting: Family

## 2020-10-03 VITALS — BP 125/91 | HR 90 | Temp 97.5°F | Resp 16 | Wt 250.0 lb

## 2020-10-03 DIAGNOSIS — R109 Unspecified abdominal pain: Secondary | ICD-10-CM | POA: Diagnosis not present

## 2020-10-03 DIAGNOSIS — K8020 Calculus of gallbladder without cholecystitis without obstruction: Secondary | ICD-10-CM

## 2020-10-03 DIAGNOSIS — Z09 Encounter for follow-up examination after completed treatment for conditions other than malignant neoplasm: Secondary | ICD-10-CM

## 2020-10-03 NOTE — Patient Instructions (Addendum)
Keep consultation with [facility] on 10/26/2020.  Continue Ibuprofen for pain management.  Monitor symptoms and report to Urgent Care or Emergency Department if symptoms worsen or new symptoms develop prior to 10/26/2020.  Follow-up with primary provider as scheduled.  Cholelithiasis  Cholelithiasis happens when gallstones form in the gallbladder. The gallbladder stores bile. Bile is a fluid that helps digest fats. Bile can harden and form into gallstones. If they cause a blockage, they can cause pain (gallbladder attack). What are the causes? This condition may be caused by:  Some blood diseases, such as sickle cell anemia.  Too much of a fat-like substance (cholesterol) in your bile.  Not enough bile salts in your bile. These salts help the body absorb and digest fats.  The gallbladder not emptying fully or often enough. This is common in pregnant women. What increases the risk? The following factors may make you more likely to develop this condition:  Being female.  Being pregnant many times.  Eating a lot of fried foods, fat, and refined carbs (refined carbohydrates).  Being very overweight (obese).  Being older than age 86.  Using medicines with female hormones in them for a long time.  Losing weight fast.  Having gallstones in your family.  Having some health problems, such as diabetes, Crohn's disease, or liver disease. What are the signs or symptoms? Often, there may be gallstones but no symptoms. These gallstones are called silent gallstones. If a gallstone causes a blockage, you may get sudden pain. The pain:  Can be in the upper right part of your belly (abdomen).  Normally comes at night or after you eat.  Can last an hour or more.  Can spread to your right shoulder, back, or chest.  Can feel like discomfort, burning, or fullness in the upper part of your belly (indigestion). If the blockage lasts more than a few hours, you can get an  infection or swelling. You may:  Feel like you may vomit.  Vomit.  Feel bloated.  Have belly pain for 5 hours or more.  Feel tender in your belly, often in the upper right part and under your ribs.  Have fever or chills.  Have skin or the white parts of your eyes turn yellow (jaundice).  Have dark pee (urine) or pale poop (stool). How is this treated? Treatment for this condition depends on how bad you feel. If you have symptoms, you may need:  Home care, if symptoms are not very bad. ? Do not eat for 12-24 hours. Drink only water and clear liquids. ? Start to eat simple or clear foods after 1 or 2 days. Try broths and crackers. ? You may need medicines for pain or stomach upset or both. ? If you have an infection, you will need antibiotics.  A hospital stay, if you have very bad pain or a very bad infection.  Surgery to remove your gallbladder. You may need this if: ? Gallstones keep coming back. ? You have very bad symptoms.  Medicines to break up gallstones. Medicines: ? Are best for small gallstones. ? May be used for up to 6-12 months.  A procedure to find and take out gallstones or to break up gallstones. Follow these instructions at home: Medicines  Take over-the-counter and prescription medicines only as told by your doctor.  If you were prescribed an antibiotic medicine, take it as told by your doctor. Do not stop taking the antibiotic even if you start to feel better.  Ask your doctor if the medicine prescribed to you requires you to avoid driving or using machinery. Eating and drinking  Drink enough fluid to keep your urine pale yellow. Drink water or clear fluids. This is important when you have pain.  Eat healthy foods. Choose: ? Fewer fatty foods, such as fried foods. ? Fewer refined carbs. Avoid breads and grains that are highly processed, such as white bread and white rice. Choose whole grains, such as whole-wheat bread and brown rice. ? More fiber.  Almonds, fresh fruit, and beans are healthy sources. General instructions  Keep a healthy weight.  Keep all follow-up visits as told by your doctor. This is important. Where to find more information  [employer] of Diabetes and Digestive and Kidney Diseases: [employer] Contact a doctor if:  You have sudden pain in the upper right part of your belly. Pain might spread to your right shoulder, back, or chest.  You have been diagnosed with gallstones that have no symptoms and you get: ? Belly pain. ? Discomfort, burning, or fullness in the upper part of your abdomen.  You have dark urine or pale stools. Get help right away if:  You have sudden pain in the upper right part of your abdomen, and the pain lasts more than 2 hours.  You have pain in your abdomen, and: ? It lasts more than 5 hours. ? It keeps getting worse.  You have a fever or chills.  You keep feeling like you may vomit.  You keep vomiting.  Your skin or the white parts of your eyes turn yellow. Summary  Cholelithiasis happens when gallstones form in the gallbladder.  This condition may be caused by a blood disease, too much of a fat-like substance in the bile, or not enough bile salts in bile.  Treatment for this condition depends on how bad you feel.  If you have symptoms, do not eat or drink. You may need medicines. You may need a hospital stay for very bad pain or a very bad infection.  You may need surgery if gallstones keep coming back or if you have very bad symptoms. This information is not intended to replace advice given to you by your health care provider. Make sure you discuss any questions you have with your health care provider. Document Revised: 08/05/2019 Document Reviewed: 05/09/2019 Elsevier Patient Education  2021 [employer].

## 2020-10-03 NOTE — Progress Notes (Signed)
Patient ID: Suzanne [name], female    DOB: January 02, 1991  MRN: [id number]  CC: Hospital Follow-Up  Subjective: Suzanne [name] is a 30 y.o. female who presents for hospital follow-up. Her concerns today include:   1. HOSPITAL DISCHARGE FOLLOW-UP: Visit 09/29/2020 at the [facility] Emergency Department per MD note: Urine shows no evidence for infection and no blood.  Low suspicion for symptomatic kidney stone at this time.  No evidence for pyelonephritis or urinary tract infection.  Discussed these results with patient at bedside.  Reviewed all of her results.  Symptoms today most likely secondary to symptomatic cholelithiasis.  Will refer her to general surgery for possible cholecystectomy in the outpatient setting her symptoms continue.  Encourage naproxen and Tylenol for pain control.  Stable for discharge home at this time.  10/03/2020: Reports still having right-sided discomfort which comes and goes. Taking Ibuprofen 800 mg with mild relief. Denies nausea and vomiting. Endorses decreased appetite. Drinking plenty of fluids. Scheduled for consultation with [facility] on 10/26/2020.  Time since discharge: 4 days Hospital/facility: [facility] Emergency Department Diagnosis: symptomatic cholelithiasis, abdominal pain, gallstones Procedures/tests: CMP, CBC, lipase, I-Stat Beta hCG, ultrasound abdomen, urinalysis Consultants: none New medications:  Naproxen and Tylenol Discharge instructions: referral to General Surgery and follow-up with PCP Status: stable  Patient Active Problem List   Diagnosis Date Noted  . Gastroesophageal reflux disease 10/11/2019  . Generalized anxiety disorder 10/11/2019  . Vitamin D deficiency 10/11/2019  . Obesity 07/23/2018     Current Outpatient Medications on File Prior to Visit  Medication Sig Dispense Refill  . pantoprazole (PROTONIX) 40 MG tablet Take 1 tablet (40 mg total) by mouth daily. 30 tablet 3   No current  facility-administered medications on file prior to visit.    No Known Allergies  Social History   Socioeconomic History  . Marital status: Married    Spouse name: Not on file  . Number of children: 4  . Years of education: Not on file  . Highest education level: Not on file  Occupational History  . Not on file  Tobacco Use  . Smoking status: Never Smoker  . Smokeless tobacco: Never Used  Substance and Sexual Activity  . Alcohol use: No  . Drug use: No  . Sexual activity: Yes    Partners: Male    Birth control/protection: Surgical  Other Topics Concern  . Not on file  Social History Narrative   ** Merged History Encounter **       Social Determinants of Health   Financial Resource Strain: Not on file  Food Insecurity: Not on file  Transportation Needs: Not on file  Physical Activity: Not on file  Stress: Not on file  Social Connections: Not on file  Intimate Partner Violence: Not on file    Family History  Problem Relation Age of Onset  . Varicose Veins Mother   . Diabetes Father   . Healthy Child   . Healthy Child   . Healthy Child   . Healthy Child     Past Surgical History:  Procedure Laterality Date  . NO PAST SURGERIES    . VAGINAL DELIVERY N/A 10/24/2016   Procedure: VAGINAL DELIVERY;  Surgeon: Lavina Hamman, MD;  Location: [facility] BIRTHING SUITES;  Service: Obstetrics;  Laterality: N/A;    ROS: Review of Systems Negative except as stated above  PHYSICAL EXAM: BP (!) 125/91   Pulse 90   Temp (!) 97.5 F (36.4 C)  Resp 16   Wt 250 lb (113.4 kg)   LMP 09/19/2020   SpO2 98%   BMI 44.29 kg/m   Physical Exam HENT:     Head: Normocephalic and atraumatic.  Eyes:     Extraocular Movements: Extraocular movements intact.     Conjunctiva/sclera: Conjunctivae normal.     Pupils: Pupils are equal, round, and reactive to light.  Cardiovascular:     Rate and Rhythm: Normal rate and regular rhythm.     Heart sounds: Normal heart sounds.  Pulmonary:      Effort: Pulmonary effort is normal.     Breath sounds: Normal breath sounds.  Abdominal:     General: Bowel sounds are normal.     Palpations: Abdomen is soft.  Musculoskeletal:     Cervical back: Normal range of motion and neck supple.  Neurological:     General: No focal deficit present.     Mental Status: She is alert and oriented to person, place, and time.  Psychiatric:        Mood and Affect: Mood normal.        Behavior: Behavior normal.     ASSESSMENT AND PLAN: 1. Hospital discharge follow-up: - Stable since hospital discharge. Symptoms not completely resolved.   2. Symptomatic cholelithiasis: 3. Gallstones: 4. Abdominal pain, unspecified abdominal location: - Continue Ibuprofen for abdominal discomfort. - Keep appointment scheduled with [facility] 10/26/2020. - Follow-up with primary provider as scheduled.   Patient was given the opportunity to ask questions.  Patient verbalized understanding of the plan and was able to repeat key elements of the plan. Patient was given clear instructions to go to Emergency Department or return to medical center if symptoms don't improve, worsen, or new problems develop.The patient verbalized understanding.   No orders of the defined types were placed in this encounter.    Requested Prescriptions    No prescriptions requested or ordered in this encounter    Follow-up with primary provide as scheduled.  Rema Fendt, NP

## 2020-10-08 ENCOUNTER — Other Ambulatory Visit: Payer: Self-pay

## 2020-10-08 ENCOUNTER — Ambulatory Visit (INDEPENDENT_AMBULATORY_CARE_PROVIDER_SITE_OTHER): Payer: 59 | Admitting: Surgery

## 2020-10-08 ENCOUNTER — Encounter: Payer: Self-pay | Admitting: Surgery

## 2020-10-08 VITALS — BP 119/81 | HR 73 | Temp 98.1°F | Ht 63.0 in | Wt 250.8 lb

## 2020-10-08 DIAGNOSIS — K8020 Calculus of gallbladder without cholecystitis without obstruction: Secondary | ICD-10-CM | POA: Diagnosis not present

## 2020-10-08 NOTE — Patient Instructions (Addendum)
Our surgery scheduler will call you within 24-48 hours to schedule your surgery. Please have the Blue surgery sheet available when speaking with her.    Minimally Invasive Cholecystectomy, Care After This sheet gives you information about how to care for yourself after your procedure. Your doctor may also give you more specific instructions. If you have problems or questions, contact your doctor. What can I expect after the procedure? After the procedure, it is common:  To have pain at the areas of surgery. You will be given medicines for pain.  To vomit or feel like you may vomit.  To feel fullness in the belly (bloating) or to have pain in the shoulder. This comes from the gas that was used during the surgery. Follow these instructions at home: Medicines  Take over-the-counter and prescription medicines only as told by your doctor.  If you were prescribed an antibiotic medicine, take it as told by your doctor. Do not stop using the antibiotic even if you start to feel better.  Ask your doctor if the medicine prescribed to you: ? Requires you to avoid driving or using machinery. ? Can cause trouble pooping (constipation). You may need to take these actions to prevent or treat trouble pooping:  Drink enough fluid to keep your pee (urine) pale yellow.  Take over-the-counter or prescription medicines.  Eat foods that are high in fiber. These include beans, whole grains, and fresh fruits and vegetables.  Limit foods that are high in fat and sugar. These include fried or sweet foods. Incision care  Follow instructions from your doctor about how to take care of your cuts from surgery (incisions). Make sure you: ? Wash your hands with soap and water for at least 20 seconds before and after you change your bandage (dressing). If you cannot use soap and water, use hand sanitizer. ? Change your bandage as told by your doctor. ? Leave stitches (sutures), skin glue, or skin tape (adhesive)  strips in place. They may need to stay in place for 2 weeks or longer. If tape strips get loose and curl up, you may trim the loose edges. Do not remove tape strips completely unless your doctor says it is okay.  Do not take baths, swim, or use a hot tub until your doctor approves. Ask your doctor if you may take showers. You may only be allowed to take sponge baths.  Check your surgery area every day for signs of infection. Check for: ? More redness, swelling, or pain. ? Fluid or blood. ? Warmth. ? Pus or a bad smell.   Activity  Rest as told by your doctor.  Do not sit for a long time without moving. Get up to take short walks every 1-2 hours. This is important. Ask for help if you feel weak or unsteady.  Do not lift anything that is heavier than 10 lb (4.5 kg), or the limit that you are told, until your doctor says that it is safe.  Do not play contact sports until your doctor says it is okay.  Do not return to work or school until your doctor says it is okay.  Return to your normal activities as told by your doctor. Ask your doctor what activities are safe for you. General instructions  If you were given a medicine to help you relax (sedative) during your procedure, it can affect you for many hours. Do not drive or use machinery until your doctor says that it is safe.  Keep all follow-up  visits as told by your doctor. This is important. Contact a doctor if:  You get a rash.  You have more redness, swelling, or pain around your cuts from surgery.  You have fluid or blood coming from your cuts from surgery.  Your cuts from surgery feel warm to the touch.  You have pus or a bad smell coming from your cuts from surgery.  You have a fever.  One or more of your cuts from surgery breaks open. Get help right away if:  You have trouble breathing.  You have chest pain.  You have pain that is getting worse in your shoulders.  You faint or feel dizzy when you stand.  You  have very bad pain in your belly (abdomen).  You feel like you may vomit or you vomit, and this lasts for more than one day.  You have leg pain. Summary  After your surgery, it is common to have pain at the areas of surgery. You may also have vomiting or fullness in the belly.  Follow your doctor's instructions about medicine, activity restrictions, and caring for your surgery areas. Do not do activities that require a lot of effort.  Contact a doctor if you have a fever or other signs of infection, such as more redness, swelling, or pain around the cuts from surgery.  Get help right away if you have chest pain, increasing pain in the shoulders, or trouble breathing. This information is not intended to replace advice given to you by your health care provider. Make sure you discuss any questions you have with your health care provider. Document Revised: 05/31/2019 Document Reviewed: 05/31/2019 Elsevier Patient Education  2021 Else[employer] Inc.     Cholelithiasis  Cholelithiasis is a disease in which gallstones form in the gallbladder. The gallbladder is an organ that stores bile. Bile is a fluid that helps to digest fats. Gallstones begin as small crystals and can slowly grow into stones. They may cause no symptoms until they block the gallbladder duct, or cystic duct, when the gallbladder tightens (contracts) after food is eaten. This can cause pain and is known as a gallbladder attack, or biliary colic. There are two main types of gallstones:  Cholesterol stones. These are the most common type of gallstone. These stones are made of hardened cholesterol and are usually yellow-green in color. Cholesterol is a fat-like substance that is made in the liver.  Pigment stones. These are dark in color and are made of a red-yellow substance, called bilirubin,that forms when hemoglobin from red blood cells breaks down. What are the causes? This condition may be caused by an imbalance in the  different parts that make bile. This can happen if the bile:  Has too much bilirubin. This can happen in certain blood diseases, such as sickle cell anemia.  Has too much cholesterol.  Does not have enough bile salts. These salts help the body absorb and digest fats. In some cases, this condition can also be caused by the gallbladder not emptying completely or often enough. This is common during pregnancy. What increases the risk? The following factors may make you more likely to develop this condition:  Being female.  Having multiple pregnancies. Health care providers sometimes advise removing diseased gallbladders before future pregnancies.  Eating a diet that is heavy in fried foods, fat, and refined carbohydrates, such as white bread and white rice.  Being obese.  Being older than age 32.  Using medicines that contain female hormones (estrogen) for a  long time.  Losing weight quickly.  Having a family history of gallstones.  Having certain medical problems, such as: ? Diabetes mellitus. ? Cystic fibrosis. ? Crohn's disease. ? Cirrhosis or other long-term (chronic) liver disease. ? Certain blood diseases, such as sickle cell anemia or leukemia. What are the signs or symptoms? In many cases, having gallstones causes no symptoms. When you have gallstones but do not have symptoms, you have silent gallstones. If a gallstone blocks your bile duct, it can cause a gallbladder attack. The main symptom of a gallbladder attack is sudden pain in the upper right part of the abdomen. The pain:  Usually comes at night or after eating.  Can last for one hour or more.  Can spread to your right shoulder, back, or chest.  Can feel like indigestion. This is discomfort, burning, or fullness in your upper abdomen. If the bile duct is blocked for more than a few hours, it can cause an infection or inflammation of your gallbladder (cholecystitis), liver, or pancreas. This can cause:  Nausea  or vomiting.  Bloating.  Pain in your abdomen that lasts for 5 hours or longer.  Tenderness in your upper abdomen, often in the upper right section and under your rib cage.  Fever or chills.  Skin or the white parts of your eyes turning yellow (jaundice). This usually happens when a stone has blocked bile from passing through the common bile duct.  Dark urine or light-colored stools. How is this diagnosed? This condition may be diagnosed based on:  A physical exam.  Your medical history.  Ultrasound.  CT scan.  MRI. You may also have other tests, including:  Blood tests to check for signs of an infection or inflammation.  Cholescintigraphy, or HIDA scan. This is a scan of your gallbladder and bile ducts (biliary system) using non-harmful radioactive material and special cameras that can see the radioactive material.  Endoscopic retrograde cholangiopancreatogram. This involves inserting a small tube with a camera on the end (endoscope) through your mouth to look at bile ducts and check for blockages. How is this treated? Treatment for this condition depends on the severity of the condition. Silent gallstones do not need treatment. Treatment may be needed if a blockage causes a gallbladder attack or other symptoms. Treatment may include:  Home care, if symptoms are not severe. ? During a simple gallbladder attack, stop eating and drinking for 12-24 hours (except for water and clear liquids). This helps to "cool down" your gallbladder. After 1 or 2 days, you can start to eat a diet of simple or clear foods, such as broths and crackers. ? You may also need medicines for pain or nausea or both. ? If you have cholecystitis and an infection, you will need antibiotics.  A hospital stay, if needed for pain control or for cholecystitis with severe infection.  Cholecystectomy, or surgery to remove your gallbladder. This is the most common treatment if all other treatments have not  worked.  Medicines to break up gallstones. These are most effective at treating small gallstones. Medicines may be used for up to 6-12 months.  Endoscopic retrograde cholangiopancreatogram. A small basket can be attached to the endoscope and used to capture and remove gallstones, mainly those that are in the common bile duct. Follow these instructions at home: Medicines  Take over-the-counter and prescription medicines only as told by your health care provider.  If you were prescribed an antibiotic medicine, take it as told by your health care provider.  Do not stop taking the antibiotic even if you start to feel better.  Ask your health care provider if the medicine prescribed to you requires you to avoid driving or using machinery. Eating and drinking  Drink enough fluid to keep your urine pale yellow. This is important during a gallbladder attack. Water and clear liquids are preferred.  Follow a healthy diet. This includes: ? Reducing fatty foods, such as fried food and foods high in cholesterol. ? Reducing refined carbohydrates, such as white bread and white rice. ? Eating more fiber. Aim for foods such as almonds, fruit, and beans. Alcohol use  If you drink alcohol: ? Limit how much you use to:  0-1 drink a day for nonpregnant women.  0-2 drinks a day for men. ? Be aware of how much alcohol is in your drink. In the [place] one drink equals one 12 oz bottle of beer (355 mL), one 5 oz glass of wine (148 mL), or one 1 oz glass of hard liquor (44 mL). General instructions  Do not use any products that contain nicotine or tobacco, such as cigarettes, e-cigarettes, and chewing tobacco. If you need help quitting, ask your health care provider.  Maintain a healthy weight.  Keep all follow-up visits as told by your health care provider. These may include consultations with a surgeon or specialist. This is important. Where to find more information  [employer] and  Digestive and Kidney Diseases: [employer].tnwww.niddk.nih.gov Contact a health care provider if:  You think you have had a gallbladder attack.  You have been diagnosed with silent gallstones and you develop pain in your abdomen or indigestion.  You begin to have attacks more often.  You have dark urine or light-colored stools. Get help right away if:  You have pain from a gallbladder attack that lasts for more than 2 hours.  You have pain in your abdomen that lasts for more than 5 hours or is getting worse.  You have a fever or chills.  You have nausea and vomiting that do not go away.  You develop jaundice. Summary  Cholelithiasis is a disease in which gallstones form in the gallbladder.  This condition may be caused by an imbalance in the different parts that make bile. This can happen if your bile has too much bilirubin or cholesterol, or does not have enough bile salts.  Treatment for gallstones depends on the severity of the condition. Silent gallstones do not need treatment.  If gallstones cause a gallbladder attack or other symptoms, treatment usually involves not eating or drinking anything. Treatment may also include pain medicines and antibiotics, and it sometimes includes a hospital stay.  Surgery to remove the gallbladder is common if all other treatments have not worked. This information is not intended to replace advice given to you by your health care provider. Make sure you discuss any questions you have with your health care provider. Document Revised: 05/09/2019 Document Reviewed: 05/09/2019 Elsevier Patient Education  2021 Elsevier Inc.  Gallbladder Eating Plan If you have a gallbladder condition, you may have trouble digesting fats. Eating a low-fat diet can help reduce your symptoms, and may be helpful before and after having surgery to remove your gallbladder (cholecystectomy). Your health care provider may recommend that you work with a diet and nutrition specialist  (dietitian) to help you reduce the amount of fat in your diet. What are tips for following this plan? General guidelines  Limit your fat intake to less than 30% of  your total daily calories. If you eat around 1,800 calories each day, this is less than 60 grams (g) of fat per day.  Fat is an important part of a healthy diet. Eating a low-fat diet can make it hard to maintain a healthy body weight. Ask your dietitian how much fat, calories, and other nutrients you need each day.  Eat small, frequent meals throughout the day instead of three large meals.  Drink at least 8-10 cups of fluid a day. Drink enough fluid to keep your urine clear or pale yellow.  Limit alcohol intake to no more than 1 drink a day for nonpregnant women and 2 drinks a day for men. One drink equals 12 oz of beer, 5 oz of wine, or 1 oz of hard liquor. Reading food labels  Check Nutrition Facts on food labels for the amount of fat per serving. Choose foods with less than 3 grams of fat per serving.   Shopping  Choose nonfat and low-fat healthy foods. Look for the words "nonfat," "low fat," or "fat free."  Avoid buying processed or prepackaged foods. Cooking  Cook using low-fat methods, such as baking, broiling, grilling, or boiling.  Cook with small amounts of healthy fats, such as olive oil, grapeseed oil, canola oil, or sunflower oil. What foods are recommended?  All fresh, frozen, or canned fruits and vegetables.  Whole grains.  Low-fat or non-fat (skim) milk and yogurt.  Lean meat, skinless poultry, fish, eggs, and beans.  Low-fat protein supplement powders or drinks.  Spices and herbs. What foods are not recommended?  High-fat foods. These include baked goods, fast food, fatty cuts of meat, ice cream, french toast, sweet rolls, pizza, cheese bread, foods covered with butter, creamy sauces, or cheese.  Fried foods. These include french fries, tempura, battered fish, breaded chicken, fried breads, and  sweets.  Foods with strong odors.  Foods that cause bloating and gas. Summary  A low-fat diet can be helpful if you have a gallbladder condition, or before and after gallbladder surgery.  Limit your fat intake to less than 30% of your total daily calories. This is about 60 g of fat if you eat 1,800 calories each day.  Eat small, frequent meals throughout the day instead of three large meals. This information is not intended to replace advice given to you by your health care provider. Make sure you discuss any questions you have with your health care provider. Document Revised: 02/02/2020 Document Reviewed: 02/02/2020 Elsevier Patient Education  2021 [employer].

## 2020-10-08 NOTE — Progress Notes (Signed)
10/08/2020  Reason for Visit:  Symptomatic cholelithiasis  History of Present Illness: Suzanne [name] is a 30 y.o. female presenting for evaluation of symptomatic cholelithiasis.  The patient was seen in the ED on 09/29/20 with RUQ pain associated with nausea and vomiting.  This started the night of 4/1 and the patient presented early morning on 4/2.  She had [employer] for dinner that night.  In the ED, her workup included labs which showed normal LFTs and normal WBC, and U/S which showed numerous small gallstones without any wall thickening or pericholecystic fluid.  This is the first episode of such pain.  She reports knowing about having gallstones about 4 years ago during her pregnancy, but she had never had any issues with her gallbladder until last week.  This week, she has been doing well, but has also been limiting the type of po intake and she's been doing mostly liquids.    Patient works at [employer].  Although her visit to the ED was at [facility], she was also referred to [facility], but her appointment there was not until the end of this month and she wanted to be seen sooner and closer to work.  Past Medical History: Past Medical History:  Diagnosis Date  . Obesity      Past Surgical History: Past Surgical History:  Procedure Laterality Date  . NO PAST SURGERIES    . VAGINAL DELIVERY N/A 10/24/2016   Procedure: VAGINAL DELIVERY;  Surgeon: Todd Meisinger, MD;  Location: [facility];  Service: Obstetrics;  Laterality: N/A;    Home Medications: Prior to Admission medications   Medication Sig Start Date End Date Taking? Authorizing Provider  pantoprazole (PROTONIX) 40 MG tablet Take 1 tablet (40 mg total) by mouth daily. 08/30/19  Yes Wallace, Catherine Lauren, DO    Allergies: No Known Allergies  Social History:  reports that she has never smoked. She has never used smokeless tobacco. She reports that she does not drink alcohol and does not use drugs.   Family History: Family  History  Problem Relation Age of Onset  . Varicose Veins Mother   . Diabetes Father   . Healthy Child   . Healthy Child   . Healthy Child   . Healthy Child     Review of Systems: Review of Systems  Constitutional: Negative for chills and fever.  HENT: Negative for hearing loss.   Respiratory: Negative for shortness of breath.   Cardiovascular: Negative for chest pain.  Gastrointestinal: Positive for abdominal pain, nausea and vomiting. Negative for constipation and diarrhea.  Genitourinary: Negative for dysuria.  Musculoskeletal: Negative for myalgias.  Skin: Negative for rash.  Neurological: Negative for dizziness.  Psychiatric/Behavioral: Negative for depression.    Physical Exam BP 119/81   Pulse 73   Temp 98.1 F (36.7 C) (Oral)   Ht 5' 3" (1.6 m)   Wt 250 lb 12.8 oz (113.8 kg)   LMP 09/19/2020   SpO2 99%   BMI 44.43 kg/m  CONSTITUTIONAL: No acute distress HEENT:  Normocephalic, atraumatic, extraocular motion intact. NECK: Trachea is midline, and there is no jugular venous distension.  RESPIRATORY:  Normal respiratory effort without pathologic use of accessory muscles. CARDIOVASCULAR: Regular rhythm and rate. GI: The abdomen is soft, obese, non-distended, with some residual discomfort to palpation in the RUQ.  Negative Murphy's sign.  MUSCULOSKELETAL:  Normal muscle strength and tone in all four extremities.  No peripheral edema or cyanosis. SKIN: Skin turgor is normal. There are no pathologic skin   lesions.  NEUROLOGIC:  Motor and sensation is grossly normal.  Cranial nerves are grossly intact. PSYCH:  Alert and oriented to person, place and time. Affect is normal.  Laboratory Analysis: Labs from 09/29/20: Na 133, K 4.1, Cl 102, CO2 23, BUN 13, Cr 0.8.  LFTs within normal.  WBC 8.1, Hgb 11.3, Hct 35.8, Plt 337  Imaging: U/S Abdomen 09/29/20: IMPRESSION: Numerous small gallstones.  No evidence of acute cholecystitis.  Assessment and Plan: This is a 30 y.o.  female with symptomatic cholelithiasis.  --This was the patient's first episode of biliary colic despite of hx of cholelithiasis for the past 4 years.  However, the patient states that this pain was so severe, and the worst pain she has ever had, and is very interested in proceeding with surgery.  Discussed with her the role of robotic assisted cholecystectomy and discussed with her the risks of bleeding, infection, and injury to surrounding structures.  She's willing to proceed.  Reviewed with her that this is an outpatient surgery, and also the post-op activity restrictions particularly in her line of work.  She would like to schedule surgery for 10/30/20.  She's aware of the need for COVID-19 testing prior to surgery.  All of her questions have been answered.  Face-to-face time spent with the patient and care providers was 45 minutes, with more than 50% of the time spent counseling, educating, and coordinating care of the patient.     Howie Ill, MD [facility]

## 2020-10-08 NOTE — H&P (View-Only) (Signed)
10/08/2020  Reason for Visit:  Symptomatic cholelithiasis  History of Present Illness: Suzanne [name] is a 29 y.o. female presenting for evaluation of symptomatic cholelithiasis.  The patient was seen in the ED on 09/29/20 with RUQ pain associated with nausea and vomiting.  This started the night of 4/1 and the patient presented early morning on 4/2.  She had [employer] for dinner that night.  In the ED, her workup included labs which showed normal LFTs and normal WBC, and U/S which showed numerous small gallstones without any wall thickening or pericholecystic fluid.  This is the first episode of such pain.  She reports knowing about having gallstones about 4 years ago during her pregnancy, but she had never had any issues with her gallbladder until last week.  This week, she has been doing well, but has also been limiting the type of po intake and she's been doing mostly liquids.    Patient works at [employer].  Although her visit to the ED was at [facility], she was also referred to [facility], but her appointment there was not until the end of this month and she wanted to be seen sooner and closer to work.  Past Medical History: Past Medical History:  Diagnosis Date  . Obesity      Past Surgical History: Past Surgical History:  Procedure Laterality Date  . NO PAST SURGERIES    . VAGINAL DELIVERY N/A 10/24/2016   Procedure: VAGINAL DELIVERY;  Surgeon: Lavina Hamman, MD;  Location: [facility];  Service: Obstetrics;  Laterality: N/A;    Home Medications: Prior to Admission medications   Medication Sig Start Date End Date Taking? Authorizing Provider  pantoprazole (PROTONIX) 40 MG tablet Take 1 tablet (40 mg total) by mouth daily. 08/30/19  Yes Arvilla Market, DO    Allergies: No Known Allergies  Social History:  reports that she has never smoked. She has never used smokeless tobacco. She reports that she does not drink alcohol and does not use drugs.   Family History: Family  History  Problem Relation Age of Onset  . Varicose Veins Mother   . Diabetes Father   . Healthy Child   . Healthy Child   . Healthy Child   . Healthy Child     Review of Systems: Review of Systems  Constitutional: Negative for chills and fever.  HENT: Negative for hearing loss.   Respiratory: Negative for shortness of breath.   Cardiovascular: Negative for chest pain.  Gastrointestinal: Positive for abdominal pain, nausea and vomiting. Negative for constipation and diarrhea.  Genitourinary: Negative for dysuria.  Musculoskeletal: Negative for myalgias.  Skin: Negative for rash.  Neurological: Negative for dizziness.  Psychiatric/Behavioral: Negative for depression.    Physical Exam BP 119/81   Pulse 73   Temp 98.1 F (36.7 C) (Oral)   Ht 5\' 3"  (1.6 m)   Wt 250 lb 12.8 oz (113.8 kg)   LMP 09/19/2020   SpO2 99%   BMI 44.43 kg/m  CONSTITUTIONAL: No acute distress HEENT:  Normocephalic, atraumatic, extraocular motion intact. NECK: Trachea is midline, and there is no jugular venous distension.  RESPIRATORY:  Normal respiratory effort without pathologic use of accessory muscles. CARDIOVASCULAR: Regular rhythm and rate. GI: The abdomen is soft, obese, non-distended, with some residual discomfort to palpation in the RUQ.  Negative Murphy's sign.  MUSCULOSKELETAL:  Normal muscle strength and tone in all four extremities.  No peripheral edema or cyanosis. SKIN: Skin turgor is normal. There are no pathologic skin  lesions.  NEUROLOGIC:  Motor and sensation is grossly normal.  Cranial nerves are grossly intact. PSYCH:  Alert and oriented to person, place and time. Affect is normal.  Laboratory Analysis: Labs from 09/29/20: Na 133, K 4.1, Cl 102, CO2 23, BUN 13, Cr 0.8.  LFTs within normal.  WBC 8.1, Hgb 11.3, Hct 35.8, Plt 337  Imaging: U/S Abdomen 09/29/20: IMPRESSION: Numerous small gallstones.  No evidence of acute cholecystitis.  Assessment and Plan: This is a 30 y.o.  female with symptomatic cholelithiasis.  --This was the patient's first episode of biliary colic despite of hx of cholelithiasis for the past 4 years.  However, the patient states that this pain was so severe, and the worst pain she has ever had, and is very interested in proceeding with surgery.  Discussed with her the role of robotic assisted cholecystectomy and discussed with her the risks of bleeding, infection, and injury to surrounding structures.  She's willing to proceed.  Reviewed with her that this is an outpatient surgery, and also the post-op activity restrictions particularly in her line of work.  She would like to schedule surgery for 10/30/20.  She's aware of the need for COVID-19 testing prior to surgery.  All of her questions have been answered.  Face-to-face time spent with the patient and care providers was 45 minutes, with more than 50% of the time spent counseling, educating, and coordinating care of the patient.     Howie Ill, MD [facility]

## 2020-10-09 ENCOUNTER — Telehealth: Payer: Self-pay | Admitting: Surgery

## 2020-10-09 NOTE — Telephone Encounter (Signed)
Patient has been advised of Pre-Admission date/time, COVID Testing date and Surgery date.  Surgery Date: 10/30/20 Preadmission Testing Date: 10/19/20 (phone 1p-5p) Covid Testing Date: not needed per pre-admit, patient has been fully documented and documentation in chart.     Patient has been made aware to call [phone], between 1-3:00pm the day before surgery, to find out what time to arrive for surgery.

## 2020-10-19 ENCOUNTER — Other Ambulatory Visit: Payer: Self-pay

## 2020-10-19 ENCOUNTER — Other Ambulatory Visit
Admission: RE | Admit: 2020-10-19 | Discharge: 2020-10-19 | Disposition: A | Discharge status: Home / Self Care | Payer: 59 | Source: Ambulatory Visit | Attending: Surgery | Admitting: Surgery

## 2020-10-19 HISTORY — DX: COVID-19: U07.1

## 2020-10-19 NOTE — Patient Instructions (Addendum)
Your procedure is scheduled on: 10/30/20 Tuesday Report to the Registration Desk on the 1st floor of the [facility]. To find out your arrival time, please call [phone] between 1PM - 3PM on: 10/29/20 Monday  REMEMBER: Instructions that are not followed completely may result in serious medical risk, up to and including death; or upon the discretion of your surgeon and anesthesiologist your surgery may need to be rescheduled.  Do not eat food or drink any fluids after midnight the night before surgery.  No gum chewing, lozengers or hard candies.  TAKE THESE MEDICATIONS THE MORNING OF SURGERY WITH A SIP OF WATER:  - pantoprazole (PROTONIX) 40 MG tablet, take one the night before and one on the morning of surgery - helps to prevent nausea after surgery.    One week prior to surgery: Stop Anti-inflammatories (NSAIDS) such as Advil, Aleve, Ibuprofen, Motrin, Naproxen, Naprosyn and Aspirin based products such as Excedrin, Goodys Powder, BC Powder.  Stop ANY OVER THE COUNTER supplements until after surgery.  No Alcohol for 24 hours before or after surgery.  No Smoking including e-cigarettes for 24 hours prior to surgery.  No chewable tobacco products for at least 6 hours prior to surgery.  No nicotine patches on the day of surgery.  Do not use any "recreational" drugs for at least a week prior to your surgery.  Please be advised that the combination of cocaine and anesthesia may have negative outcomes, up to and including death. If you test positive for cocaine, your surgery will be cancelled.  On the morning of surgery brush your teeth with toothpaste and water, you may rinse your mouth with mouthwash if you wish. Do not swallow any toothpaste or mouthwash.  Do not wear jewelry, make-up, hairpins, clips or nail polish.  Do not wear lotions, powders, or perfumes.   Do not shave body from the neck down 48 hours prior to surgery just in case you cut yourself which could leave a  site for infection.  Also, freshly shaved skin may become irritated if using the CHG soap.  Contact lenses, hearing aids and dentures may not be worn into surgery.  Do not bring valuables to the hospital. [facility] is not responsible for any missing/lost belongings or valuables.   Notify your doctor if there is any change in your medical condition (cold, fever, infection).  Wear comfortable clothing (specific to your surgery type) to the hospital.  Plan for stool softeners for home use; pain medications have a tendency to cause constipation. You can also help prevent constipation by eating foods high in fiber such as fruits and vegetables and drinking plenty of fluids as your diet allows.  After surgery, you can help prevent lung complications by doing breathing exercises.  Take deep breaths and cough every 1-2 hours. Your doctor may order a device called an Incentive Spirometer to help you take deep breaths. When coughing or sneezing, hold a pillow firmly against your incision with both hands. This is called "splinting." Doing this helps protect your incision. It also decreases belly discomfort.  If you are being admitted to the hospital overnight, leave your suitcase in the car. After surgery it may be brought to your room.  If you are being discharged the day of surgery, you will not be allowed to drive home. You will need a responsible adult (18 years or older) to drive you home and stay with you that night.   If you are taking public transportation, you will need to  have a responsible adult (18 years or older) with you. Please confirm with your physician that it is acceptable to use public transportation.   Please call the Pre-admissions Testing Dept. at [phone] if you have any questions about these instructions.  Surgery Visitation Policy:  Patients undergoing a surgery or procedure may have one family member or support person with them as long as that person is not  COVID-19 positive or experiencing its symptoms.  That person may remain in the waiting area during the procedure.  Inpatient Visitation:    Visiting hours are 7 a.m. to 8 p.m. Inpatients will be allowed two visitors daily. The visitors may change each day during the patient's stay. No visitors under the age of 36. Any visitor under the age of 77 must be accompanied by an adult. The visitor must pass COVID-19 screenings, use hand sanitizer when entering and exiting the patient's room and wear a mask at all times, including in the patient's room. Patients must also wear a mask when staff or their visitor are in the room. Masking is required regardless of vaccination status.

## 2020-10-21 IMAGING — CT CT ANGIO CHEST
4 of 14 series · 18 of 46 positions shown · IV contrast (APPLIED)
Comparison: Chest radiograph August 24, 2019.

CLINICAL DATA: Chest pain

EXAM:
CT ANGIOGRAPHY CHEST WITH CONTRAST
TECHNIQUE: Multidetector CT imaging of the chest was performed using the
standard protocol during bolus administration of intravenous
contrast. Multiplanar CT image reconstructions and MIPs were
obtained to evaluate the vascular anatomy.
CONTRAST:  150mL OMNIPAQUE IOHEXOL 350 MG/ML SOLN

[Series 7: arterial · axial · arterial · 0.66mm/px · z∈[-132,-46]mm · 2 of 131 slices shown]
[im 44/131  lung]
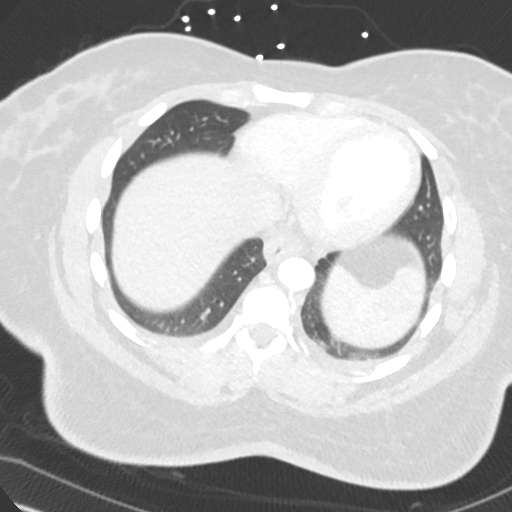
[im 87/131  lung]
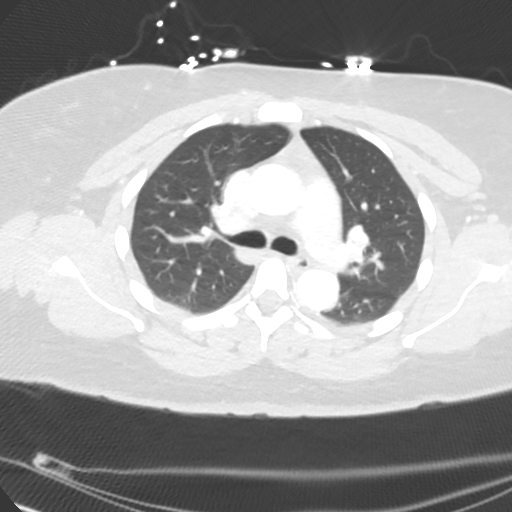

[Series 8: lung · axial · 0.66mm/px · z∈[-132,-46]mm · 2 of 131 slices shown]
[im 44/131  soft-tissue]
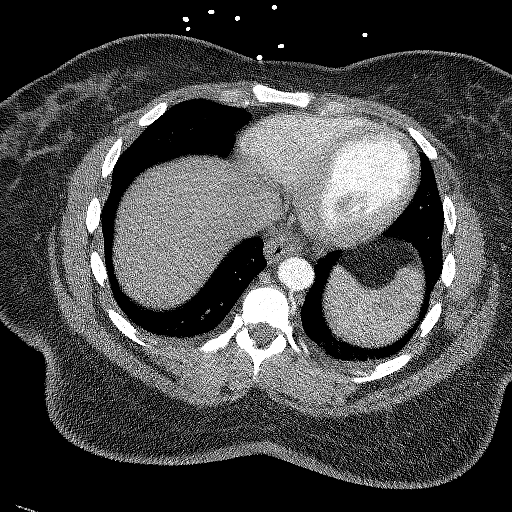
[im 87/131  soft-tissue]
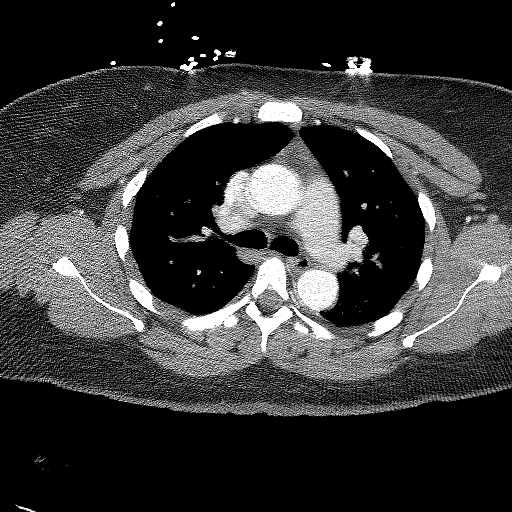

[Series 9: thins · axial · 0.66mm/px · z∈[-186,+10]mm · 7 of 373 slices shown (1 of 2)]
[im 47/373  lung]
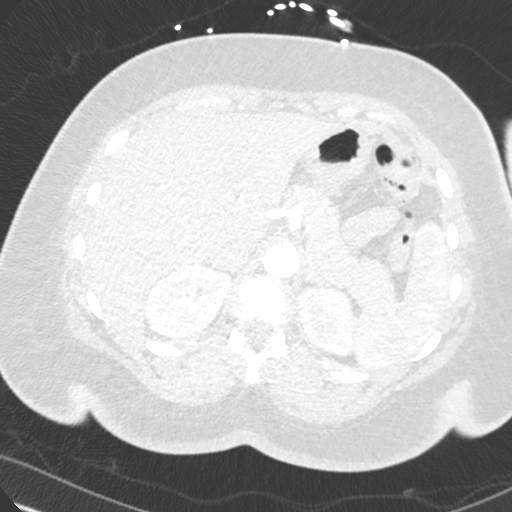
[im 94/373  soft-tissue]
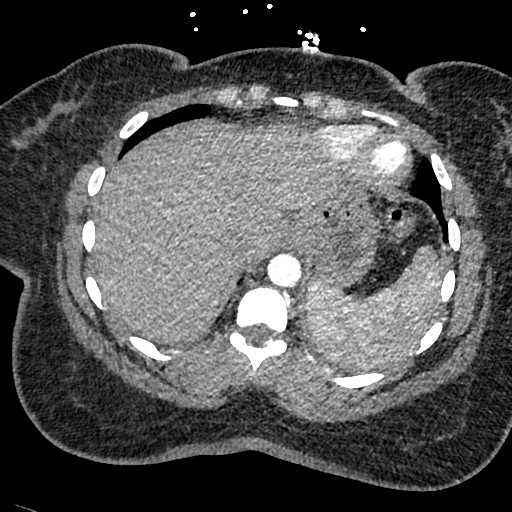
[im 140/373  lung]
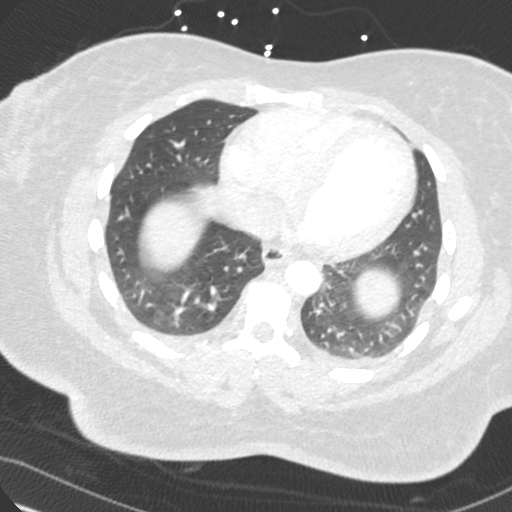
[im 187/373  soft-tissue]
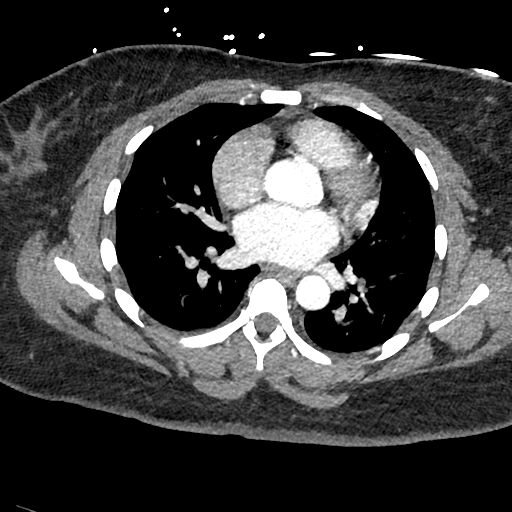
[im 233/373  lung]
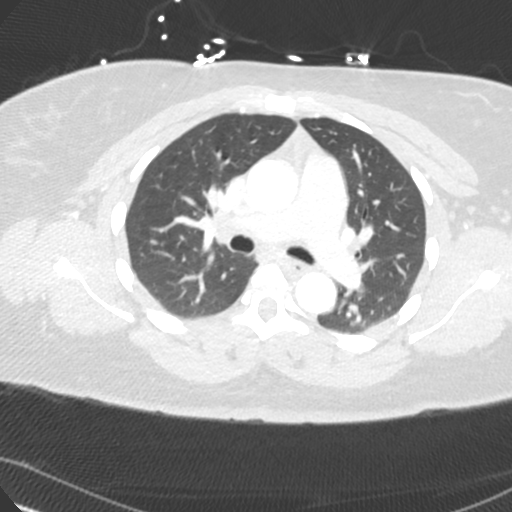
[im 280/373  soft-tissue]
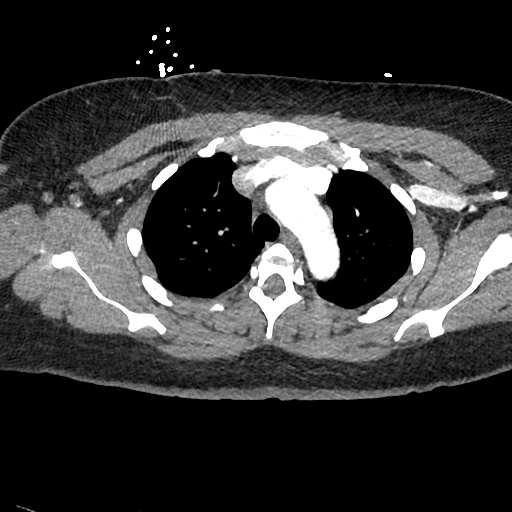
[im 326/373  lung]
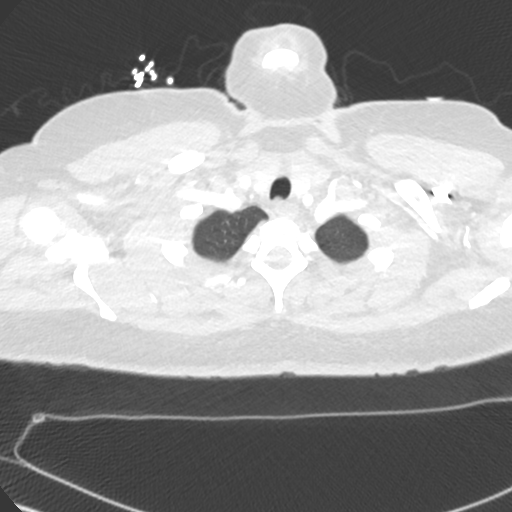

[Series 18: thins · axial · 0.66mm/px · z∈[-186,+10]mm · 7 of 373 slices shown (2 of 2)]
[im 47/373  lung]
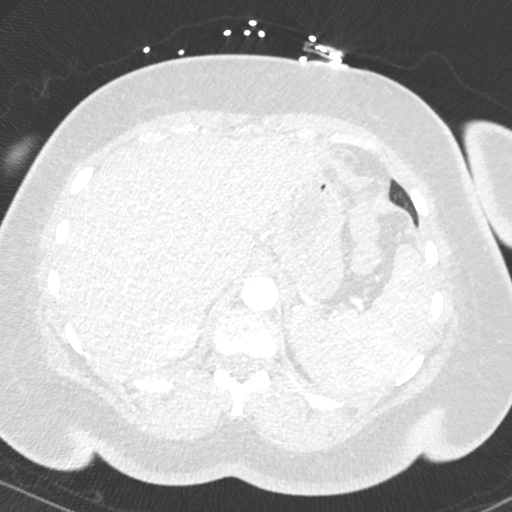
[im 94/373  lung]
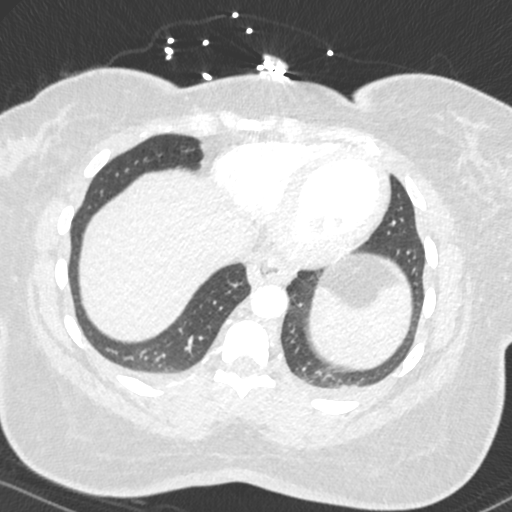
[im 140/373  lung]
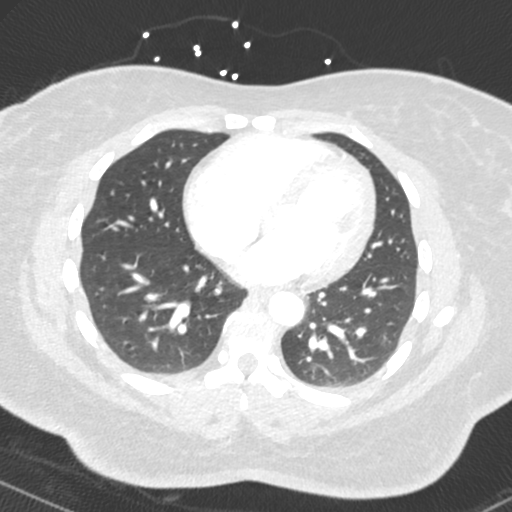
[im 187/373  lung]
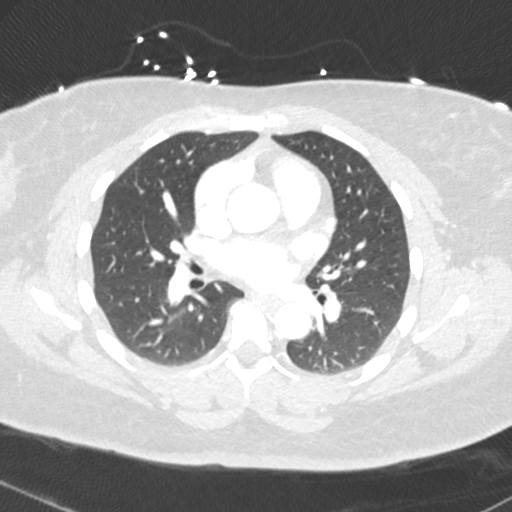
[im 233/373  lung]
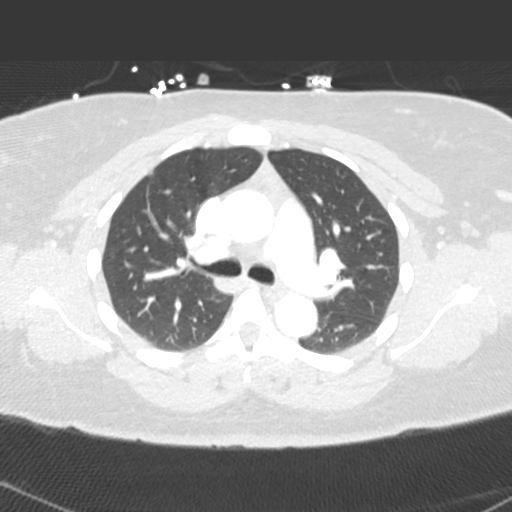
[im 280/373  lung]
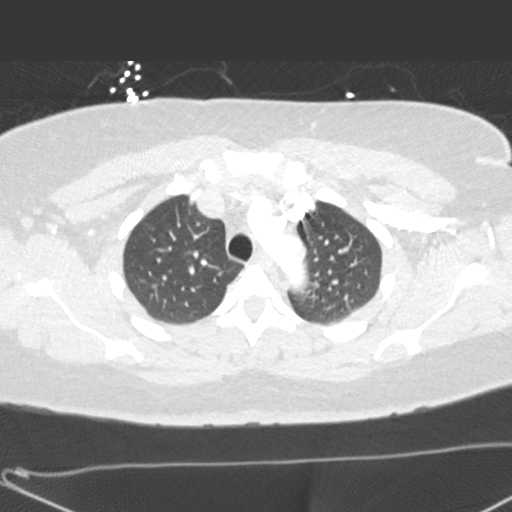
[im 326/373  lung]
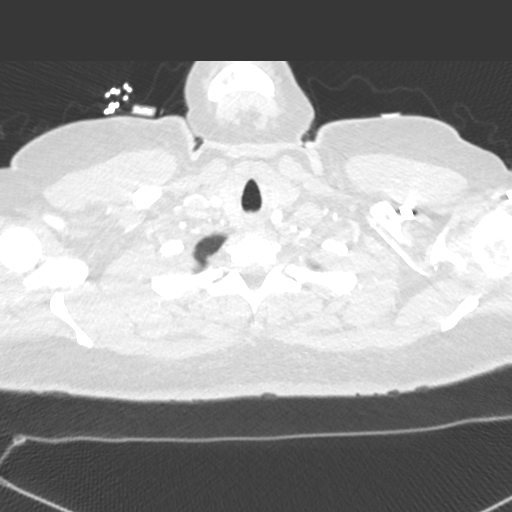

[18 of 46 positions shown; findings below may reference images not displayed]

FINDINGS: Cardiovascular: There is no demonstrable pulmonary embolus. There is
no thoracic aortic aneurysm or dissection. Visualized great vessels
appear normal. No pericardial effusion or pericardial thickening.

Mediastinum/Nodes: Thyroid appears unremarkable. There is no
appreciable thoracic adenopathy. There is a focal hiatal hernia.

Lungs/Pleura: There is slight bibasilar atelectasis. The lungs
elsewhere are clear. No pleural effusions evident.

Upper Abdomen: Visualized upper abdominal structures appear
unremarkable.

Musculoskeletal: No blastic or lytic bone lesions. No chest wall
lesions evident.

Review of the MIP images confirms the above findings.
IMPRESSION: 1. No demonstrable pulmonary embolus. No thoracic aortic aneurysm or
dissection.

2.  Slight bibasilar atelectasis.  Lungs elsewhere clear.

3.  No evident adenopathy.

4.  Hiatal hernia present.

## 2020-10-28 HISTORY — PX: CHOLECYSTECTOMY: SHX55

## 2020-10-30 ENCOUNTER — Encounter
Admission: RE | Disposition: A | Discharge status: Home / Self Care | Payer: Self-pay | Source: Home / Self Care | Attending: Surgery

## 2020-10-30 ENCOUNTER — Ambulatory Visit: Payer: 59

## 2020-10-30 ENCOUNTER — Ambulatory Visit
Admission: RE | Admit: 2020-10-30 | Discharge: 2020-10-30 | Disposition: A | Discharge status: Home / Self Care | Payer: 59 | Attending: Surgery | Admitting: Surgery

## 2020-10-30 ENCOUNTER — Encounter: Payer: Self-pay | Admitting: Surgery

## 2020-10-30 ENCOUNTER — Other Ambulatory Visit: Payer: Self-pay

## 2020-10-30 DIAGNOSIS — Z7689 Persons encountering health services in other specified circumstances: Secondary | ICD-10-CM | POA: Diagnosis not present

## 2020-10-30 DIAGNOSIS — Z833 Family history of diabetes mellitus: Secondary | ICD-10-CM | POA: Diagnosis not present

## 2020-10-30 DIAGNOSIS — K8010 Calculus of gallbladder with chronic cholecystitis without obstruction: Secondary | ICD-10-CM | POA: Insufficient documentation

## 2020-10-30 DIAGNOSIS — K828 Other specified diseases of gallbladder: Secondary | ICD-10-CM | POA: Diagnosis not present

## 2020-10-30 DIAGNOSIS — Z79899 Other long term (current) drug therapy: Secondary | ICD-10-CM | POA: Insufficient documentation

## 2020-10-30 DIAGNOSIS — K8020 Calculus of gallbladder without cholecystitis without obstruction: Secondary | ICD-10-CM | POA: Diagnosis not present

## 2020-10-30 DIAGNOSIS — K8064 Calculus of gallbladder and bile duct with chronic cholecystitis without obstruction: Secondary | ICD-10-CM | POA: Diagnosis present

## 2020-10-30 DIAGNOSIS — K219 Gastro-esophageal reflux disease without esophagitis: Secondary | ICD-10-CM | POA: Diagnosis not present

## 2020-10-30 LAB — POCT PREGNANCY, URINE: Preg Test, Ur: NEGATIVE

## 2020-10-30 SURGERY — CHOLECYSTECTOMY, ROBOT-ASSISTED, LAPAROSCOPIC
Anesthesia: General | Site: Abdomen

## 2020-10-30 MED ORDER — SUGAMMADEX SODIUM 200 MG/2ML IV SOLN
INTRAVENOUS | Status: DC | PRN
  Administered 2020-10-30: 200 mg via INTRAVENOUS

## 2020-10-30 MED ORDER — ONDANSETRON HCL 4 MG/2ML IJ SOLN
INTRAMUSCULAR | Status: AC
  Filled 2020-10-30: qty 2

## 2020-10-30 MED ORDER — CHLORHEXIDINE GLUCONATE 0.12 % MT SOLN: 15.0000 mL | Freq: Once | OROMUCOSAL | Status: AC

## 2020-10-30 MED ORDER — ACETAMINOPHEN 500 MG PO TABS
ORAL_TABLET | ORAL | Status: AC
  Administered 2020-10-30: 1000 mg via ORAL
  Filled 2020-10-30: qty 2

## 2020-10-30 MED ORDER — OXYCODONE HCL 5 MG PO TABS: 5.0000 mg | ORAL_TABLET | Freq: Once | ORAL | Status: AC | PRN

## 2020-10-30 MED ORDER — ONDANSETRON HCL 4 MG/2ML IJ SOLN
INTRAMUSCULAR | Status: DC | PRN
  Administered 2020-10-30: 4 mg via INTRAVENOUS

## 2020-10-30 MED ORDER — OXYCODONE HCL 5 MG PO TABS: 5.0000 mg | ORAL_TABLET | ORAL | 0 refills | Status: DC | PRN

## 2020-10-30 MED ORDER — FENTANYL CITRATE (PF) 100 MCG/2ML IJ SOLN
INTRAMUSCULAR | Status: AC
  Administered 2020-10-30: 25 ug via INTRAVENOUS
  Filled 2020-10-30: qty 2

## 2020-10-30 MED ORDER — CEFAZOLIN SODIUM-DEXTROSE 2-4 GM/100ML-% IV SOLN
INTRAVENOUS | Status: AC
  Filled 2020-10-30: qty 100

## 2020-10-30 MED ORDER — ACETAMINOPHEN 500 MG PO TABS: 1000.0000 mg | ORAL_TABLET | Freq: Four times a day (QID) | ORAL | Status: DC | PRN

## 2020-10-30 MED ORDER — MIDAZOLAM HCL 2 MG/2ML IJ SOLN
INTRAMUSCULAR | Status: AC
  Filled 2020-10-30: qty 2

## 2020-10-30 MED ORDER — OXYCODONE HCL 5 MG/5ML PO SOLN: 5.0000 mg | Freq: Once | ORAL | Status: AC | PRN

## 2020-10-30 MED ORDER — BUPIVACAINE-EPINEPHRINE (PF) 0.25% -1:200000 IJ SOLN
INTRAMUSCULAR | Status: DC | PRN
  Administered 2020-10-30: 30 mL via PERINEURAL

## 2020-10-30 MED ORDER — ORAL CARE MOUTH RINSE: 15.0000 mL | Freq: Once | OROMUCOSAL | Status: AC

## 2020-10-30 MED ORDER — CHLORHEXIDINE GLUCONATE CLOTH 2 % EX PADS
6.0000 | MEDICATED_PAD | Freq: Once | CUTANEOUS | Status: AC
  Administered 2020-10-30: 6 via TOPICAL

## 2020-10-30 MED ORDER — LIDOCAINE HCL (PF) 2 % IJ SOLN
INTRAMUSCULAR | Status: AC
  Filled 2020-10-30: qty 5

## 2020-10-30 MED ORDER — CHLORHEXIDINE GLUCONATE 0.12 % MT SOLN
OROMUCOSAL | Status: AC
  Administered 2020-10-30: 15 mL via OROMUCOSAL
  Filled 2020-10-30: qty 15

## 2020-10-30 MED ORDER — FENTANYL CITRATE (PF) 100 MCG/2ML IJ SOLN
INTRAMUSCULAR | Status: DC | PRN
  Administered 2020-10-30: 100 ug via INTRAVENOUS

## 2020-10-30 MED ORDER — DEXAMETHASONE SODIUM PHOSPHATE 10 MG/ML IJ SOLN
INTRAMUSCULAR | Status: AC
  Filled 2020-10-30: qty 1

## 2020-10-30 MED ORDER — PHENYLEPHRINE HCL (PRESSORS) 10 MG/ML IV SOLN
INTRAVENOUS | Status: DC | PRN
  Administered 2020-10-30: 150 ug via INTRAVENOUS
  Administered 2020-10-30: 200 ug via INTRAVENOUS
  Administered 2020-10-30: 150 ug via INTRAVENOUS
  Administered 2020-10-30: 100 ug via INTRAVENOUS

## 2020-10-30 MED ORDER — ROCURONIUM BROMIDE 100 MG/10ML IV SOLN
INTRAVENOUS | Status: DC | PRN
  Administered 2020-10-30: 20 mg via INTRAVENOUS
  Administered 2020-10-30: 60 mg via INTRAVENOUS

## 2020-10-30 MED ORDER — FENTANYL CITRATE (PF) 100 MCG/2ML IJ SOLN
25.0000 ug | INTRAMUSCULAR | Status: DC | PRN
  Administered 2020-10-30: 25 ug via INTRAVENOUS

## 2020-10-30 MED ORDER — DEXAMETHASONE SODIUM PHOSPHATE 10 MG/ML IJ SOLN
INTRAMUSCULAR | Status: DC | PRN
  Administered 2020-10-30: 6 mg via INTRAVENOUS

## 2020-10-30 MED ORDER — LIDOCAINE HCL (CARDIAC) PF 100 MG/5ML IV SOSY
PREFILLED_SYRINGE | INTRAVENOUS | Status: DC | PRN
  Administered 2020-10-30: 40 mg via INTRAVENOUS
  Administered 2020-10-30: 60 mg via INTRAVENOUS

## 2020-10-30 MED ORDER — CEFAZOLIN SODIUM-DEXTROSE 2-4 GM/100ML-% IV SOLN
2.0000 g | INTRAVENOUS | Status: AC
  Administered 2020-10-30: 2 g via INTRAVENOUS

## 2020-10-30 MED ORDER — DEXMEDETOMIDINE (PRECEDEX) IN NS 20 MCG/5ML (4 MCG/ML) IV SYRINGE
PREFILLED_SYRINGE | INTRAVENOUS | Status: AC
  Filled 2020-10-30: qty 5

## 2020-10-30 MED ORDER — BUPIVACAINE-EPINEPHRINE (PF) 0.25% -1:200000 IJ SOLN
INTRAMUSCULAR | Status: AC
  Filled 2020-10-30: qty 30

## 2020-10-30 MED ORDER — LACTATED RINGERS IV SOLN: INTRAVENOUS | Status: DC

## 2020-10-30 MED ORDER — FENTANYL CITRATE (PF) 100 MCG/2ML IJ SOLN
INTRAMUSCULAR | Status: AC
  Filled 2020-10-30: qty 2

## 2020-10-30 MED ORDER — MIDAZOLAM HCL 2 MG/2ML IJ SOLN
INTRAMUSCULAR | Status: DC | PRN
  Administered 2020-10-30: 2 mg via INTRAVENOUS

## 2020-10-30 MED ORDER — SODIUM CHLORIDE 0.9 % IV SOLN
INTRAVENOUS | Status: DC | PRN
  Administered 2020-10-30: 15 ug/min via INTRAVENOUS

## 2020-10-30 MED ORDER — GABAPENTIN 300 MG PO CAPS: 300.0000 mg | ORAL_CAPSULE | ORAL | Status: AC

## 2020-10-30 MED ORDER — IBUPROFEN 800 MG PO TABS
800.0000 mg | ORAL_TABLET | Freq: Three times a day (TID) | ORAL | 1 refills | Status: DC | PRN
Start: 2020-10-30 — End: 2021-11-28

## 2020-10-30 MED ORDER — OXYCODONE HCL 5 MG PO TABS
ORAL_TABLET | ORAL | Status: AC
  Administered 2020-10-30: 5 mg via ORAL
  Filled 2020-10-30: qty 1

## 2020-10-30 MED ORDER — GABAPENTIN 300 MG PO CAPS
ORAL_CAPSULE | ORAL | Status: AC
  Administered 2020-10-30: 300 mg via ORAL
  Filled 2020-10-30: qty 1

## 2020-10-30 MED ORDER — ACETAMINOPHEN 500 MG PO TABS: 1000.0000 mg | ORAL_TABLET | ORAL | Status: AC

## 2020-10-30 MED ORDER — DEXMEDETOMIDINE HCL 200 MCG/2ML IV SOLN
INTRAVENOUS | Status: DC | PRN
  Administered 2020-10-30: 8 ug via INTRAVENOUS
  Administered 2020-10-30: 12 ug via INTRAVENOUS

## 2020-10-30 MED ORDER — KETOROLAC TROMETHAMINE 30 MG/ML IJ SOLN
INTRAMUSCULAR | Status: AC
  Filled 2020-10-30: qty 1

## 2020-10-30 MED ORDER — ROCURONIUM BROMIDE 10 MG/ML (PF) SYRINGE
PREFILLED_SYRINGE | INTRAVENOUS | Status: AC
  Filled 2020-10-30: qty 10

## 2020-10-30 MED ORDER — PROPOFOL 10 MG/ML IV BOLUS
INTRAVENOUS | Status: AC
  Filled 2020-10-30: qty 20

## 2020-10-30 MED ORDER — PROPOFOL 10 MG/ML IV BOLUS
INTRAVENOUS | Status: DC | PRN
  Administered 2020-10-30: 200 mg via INTRAVENOUS

## 2020-10-30 MED ORDER — INDOCYANINE GREEN 25 MG IV SOLR
2.5000 mg | INTRAVENOUS | Status: AC
  Administered 2020-10-30: 2.5 mg via INTRAVENOUS
  Filled 2020-10-30: qty 1

## 2020-10-30 SURGICAL SUPPLY — 58 items
ADH SKN CLS APL DERMABOND .7 (GAUZE/BANDAGES/DRESSINGS) ×1
APL PRP STRL LF DISP 70% ISPRP (MISCELLANEOUS) ×1
BAG INFUSER PRESSURE 100CC (MISCELLANEOUS) IMPLANT
BAG SPEC RTRVL LRG 6X4 10 (ENDOMECHANICALS) ×1
CANISTER SUCT 1200ML W/VALVE (MISCELLANEOUS) IMPLANT
CANNULA REDUC XI 12-8 STAPL (CANNULA) ×1
CANNULA REDUCER 12-8 DVNC XI (CANNULA) ×1 IMPLANT
CHLORAPREP W/TINT 26 (MISCELLANEOUS) ×2 IMPLANT
CLIP VESOLOCK MED LG 6/CT (CLIP) ×2 IMPLANT
COVER WAND RF STERILE (DRAPES) IMPLANT
CUP MEDICINE 2OZ PLAST GRAD ST (MISCELLANEOUS) ×2 IMPLANT
DECANTER SPIKE VIAL GLASS SM (MISCELLANEOUS) IMPLANT
DEFOGGER SCOPE WARMER CLEARIFY (MISCELLANEOUS) ×2 IMPLANT
DERMABOND ADVANCED (GAUZE/BANDAGES/DRESSINGS) ×1
DERMABOND ADVANCED .7 DNX12 (GAUZE/BANDAGES/DRESSINGS) ×1 IMPLANT
DRAPE ARM DVNC X/XI (DISPOSABLE) ×4 IMPLANT
DRAPE COLUMN DVNC XI (DISPOSABLE) ×1 IMPLANT
DRAPE DA VINCI XI ARM (DISPOSABLE) ×4
DRAPE DA VINCI XI COLUMN (DISPOSABLE) ×1
ELECT CAUTERY BLADE TIP 2.5 (TIP) ×2
ELECT REM PT RETURN 9FT ADLT (ELECTROSURGICAL) ×2
ELECTRODE CAUTERY BLDE TIP 2.5 (TIP) ×1 IMPLANT
ELECTRODE REM PT RTRN 9FT ADLT (ELECTROSURGICAL) ×1 IMPLANT
GLOVE SURG SYN 7.0 (GLOVE) ×4 IMPLANT
GLOVE SURG SYN 7.5  E (GLOVE) ×2
GLOVE SURG SYN 7.5 E (GLOVE) ×2 IMPLANT
GOWN STRL REUS W/ TWL LRG LVL3 (GOWN DISPOSABLE) ×4 IMPLANT
GOWN STRL REUS W/TWL LRG LVL3 (GOWN DISPOSABLE) ×8
IRRIGATOR SUCT 8 DISP DVNC XI (IRRIGATION / IRRIGATOR) IMPLANT
IRRIGATOR SUCTION 8MM XI DISP (IRRIGATION / IRRIGATOR)
IV NS 1000ML (IV SOLUTION)
IV NS 1000ML BAXH (IV SOLUTION) IMPLANT
KIT PINK PAD W/HEAD ARE REST (MISCELLANEOUS) ×2
KIT PINK PAD W/HEAD ARM REST (MISCELLANEOUS) ×1 IMPLANT
LABEL OR SOLS (LABEL) ×2 IMPLANT
MANIFOLD NEPTUNE II (INSTRUMENTS) IMPLANT
NEEDLE HYPO 22GX1.5 SAFETY (NEEDLE) ×2 IMPLANT
NS IRRIG 500ML POUR BTL (IV SOLUTION) ×2 IMPLANT
OBTURATOR OPTICAL STANDARD 8MM (TROCAR) ×1
OBTURATOR OPTICAL STND 8 DVNC (TROCAR) ×1
OBTURATOR OPTICALSTD 8 DVNC (TROCAR) ×1 IMPLANT
PACK LAP CHOLECYSTECTOMY (MISCELLANEOUS) ×2 IMPLANT
PENCIL ELECTRO HAND CTR (MISCELLANEOUS) ×2 IMPLANT
POUCH SPECIMEN RETRIEVAL 10MM (ENDOMECHANICALS) ×2 IMPLANT
SEAL CANN UNIV 5-8 DVNC XI (MISCELLANEOUS) ×4 IMPLANT
SEAL XI 5MM-8MM UNIVERSAL (MISCELLANEOUS) ×4
SET TUBE SMOKE EVAC HIGH FLOW (TUBING) ×2 IMPLANT
SOLUTION ELECTROLUBE (MISCELLANEOUS) ×2 IMPLANT
SPONGE LAP 18X18 RF (DISPOSABLE) IMPLANT
SPONGE LAP 4X18 RFD (DISPOSABLE) ×2 IMPLANT
STAPLER CANNULA SEAL DVNC XI (STAPLE) ×1 IMPLANT
STAPLER CANNULA SEAL XI (STAPLE) ×1
SUT MNCRL AB 4-0 PS2 18 (SUTURE) ×2 IMPLANT
SUT VIC AB 3-0 SH 27 (SUTURE) ×2
SUT VIC AB 3-0 SH 27X BRD (SUTURE) ×1 IMPLANT
SUT VICRYL 0 AB UR-6 (SUTURE) ×4 IMPLANT
TAPE TRANSPORE STRL 2 31045 (GAUZE/BANDAGES/DRESSINGS) ×2 IMPLANT
TROCAR BALLN GELPORT 12X130M (ENDOMECHANICALS) ×2 IMPLANT

## 2020-10-30 NOTE — Discharge Instructions (Signed)
AMBULATORY SURGERY  °DISCHARGE INSTRUCTIONS ° ° °1) The drugs that you were given will stay in your system until tomorrow so for the next 24 hours you should not: ° °A) Drive an automobile °B) Make any legal decisions °C) Drink any alcoholic beverage ° ° °2) You may resume regular meals tomorrow.  Today it is better to start with liquids and gradually work up to solid foods. ° °You may eat anything you prefer, but it is better to start with liquids, then soup and crackers, and gradually work up to solid foods. ° ° °3) Please notify your doctor immediately if you have any unusual bleeding, trouble breathing, redness and pain at the surgery site, drainage, fever, or pain not relieved by medication. ° ° ° °4) Additional Instructions: ° ° ° ° ° ° ° °Please contact your physician with any problems or Same Day Surgery at [phone], Monday through Friday 6 am to 4 pm, or [place] [facility] Main number at [phone]. °

## 2020-10-30 NOTE — Anesthesia Preprocedure Evaluation (Signed)
Anesthesia Evaluation  Patient identified by MRN, date of birth, ID band Patient awake    Reviewed: Allergy & Precautions, H&P , NPO status , Patient's Chart, lab work & pertinent test results  History of Anesthesia Complications Negative for: history of anesthetic complications  Airway Mallampati: III  TM Distance: >3 FB Neck ROM: full    Dental  (+) Chipped   Pulmonary neg pulmonary ROS, neg shortness of breath,    Pulmonary exam normal        Cardiovascular Exercise Tolerance: Good (-) angina(-) Past MI and (-) DOE negative cardio ROS Normal cardiovascular exam     Neuro/Psych PSYCHIATRIC DISORDERS negative neurological ROS  negative psych ROS   GI/Hepatic Neg liver ROS, GERD  Medicated and Controlled,  Endo/Other  negative endocrine ROS  Renal/GU      Musculoskeletal   Abdominal   Peds  Hematology negative hematology ROS (+)   Anesthesia Other Findings Past Medical History: No date: COVID-19 No date: Obesity  Past Surgical History: No date: NO PAST SURGERIES 10/24/2016: VAGINAL DELIVERY; N/A     Comment:  Procedure: VAGINAL DELIVERY;  Surgeon: Lavina Hamman,               MD;  Location: [facility] BIRTHING SUITES;  Service: Obstetrics;               Laterality: N/A; No date: WISDOM TOOTH EXTRACTION  BMI    Body Mass Index: 44.29 kg/m      Reproductive/Obstetrics negative OB ROS                             Anesthesia Physical Anesthesia Plan  ASA: III  Anesthesia Plan: General ETT   Post-op Pain Management:    Induction: Intravenous  PONV Risk Score and Plan: Ondansetron, Dexamethasone, Midazolam and Treatment may vary due to age or medical condition  Airway Management Planned: Oral ETT  Additional Equipment:   Intra-op Plan:   Post-operative Plan: Extubation in OR  Informed Consent: I have reviewed the patients History and Physical, chart, labs and discussed the  procedure including the risks, benefits and alternatives for the proposed anesthesia with the patient or authorized representative who has indicated his/her understanding and acceptance.     Dental Advisory Given  Plan Discussed with: Anesthesiologist, CRNA and Surgeon  Anesthesia Plan Comments: (Patient consented for risks of anesthesia including but not limited to:  - adverse reactions to medications - damage to eyes, teeth, lips or other oral mucosa - nerve damage due to positioning  - sore throat or hoarseness - Damage to heart, brain, nerves, lungs, other parts of body or loss of life  Patient voiced understanding.)        Anesthesia Quick Evaluation

## 2020-10-30 NOTE — Interval H&P Note (Signed)
History and Physical Interval Note:  10/30/2020 9:10 AM  Suzanne [name]  has presented today for surgery, with the diagnosis of cholelithiasis.  The various methods of treatment have been discussed with the patient and family. After consideration of risks, benefits and other options for treatment, the patient has consented to  Procedure(s): XI ROBOTIC ASSISTED LAPAROSCOPIC CHOLECYSTECTOMY (N/A) INDOCYANINE GREEN FLUORESCENCE IMAGING (ICG) (N/A) as a surgical intervention.  The patient's history has been reviewed, patient examined, no change in status, stable for surgery.  I have reviewed the patient's chart and labs.  Questions were answered to the patient's satisfaction.     Suzanne [name]

## 2020-10-30 NOTE — Op Note (Signed)
  Procedure Date:  10/30/2020  Pre-operative Diagnosis:  Symptomatic cholelithiasis  Post-operative Diagnosis:  Symptomatic cholelithiasis  Procedure:  Robotic assisted cholecystectomy with ICG FireFly cholangiogram  Surgeon:  Howie Ill, MD  Anesthesia:  General endotracheal  Estimated Blood Loss:  5 ml  Specimens:  gallbladder  Complications:  None  Indications for Procedure:  This is a 30 y.o. female who presents with abdominal pain and workup revealing symptomatic cholelithiasis.  The benefits, complications, treatment options, and expected outcomes were discussed with the patient. The risks of bleeding, infection, recurrence of symptoms, failure to resolve symptoms, bile duct damage, bile duct leak, retained common bile duct stone, bowel injury, and need for further procedures were all discussed with the patient and she was willing to proceed.  Description of Procedure: The patient was correctly identified in the preoperative area and brought into the operating room.  The patient was placed supine with VTE prophylaxis in place.  Appropriate time-outs were performed.  Anesthesia was induced and the patient was intubated.  Appropriate antibiotics were infused.  The abdomen was prepped and draped in a sterile fashion. An infraumbilical incision was made. A cutdown technique was used to enter the abdominal cavity without injury, and a 12 mm robotic port was inserted.  Pneumoperitoneum was obtained with appropriate opening pressures.  Three 8-mm ports were placed in the mid abdomen at the level of the umbilicus under direct visualization.  The DaVinci platform was docked, camera targeted, and instruments were placed under direct visualization.  The gallbladder was identified.  The fundus was grasped and retracted cephalad.  Adhesions were lysed bluntly and with electrocautery. The infundibulum was grasped and retracted laterally, exposing the peritoneum overlying the gallbladder.  This  was incised with electrocautery and extended on either side of the gallbladder.  FireFly cholangiogram was then obtained, and we were able to clearly identify the cystic duct and common bile duct.  The cystic duct and cystic artery were carefully dissected with combination of cautery and blunt dissection.  Both were clipped twice proximally and once distally, cutting in between.  The gallbladder was taken from the gallbladder fossa in a retrograde fashion with electrocautery. The gallbladder was placed in an Endocatch bag. The liver bed was inspected and any bleeding was controlled with electrocautery. The right upper quadrant was then inspected again revealing intact clips, no bleeding, and no ductal injury.    The 8 mm ports were removed under direct visualization and the 12 mm port was removed.  The Endocatch bag was brought out via the umbilical incision. The fascial opening was closed using 0 vicryl suture.  Local anesthetic was infused in all incisions and the incisions were closed with 4-0 Monocryl.  The wounds were cleaned and sealed with DermaBond.  The patient was emerged from anesthesia and extubated and brought to the recovery room for further management.  The patient tolerated the procedure well and all counts were correct at the end of the case.   Howie Ill, MD

## 2020-10-30 NOTE — Transfer of Care (Signed)
Immediate Anesthesia Transfer of Care Note  Patient: Suzanne [name]  Procedure(s) Performed: XI ROBOTIC ASSISTED LAPAROSCOPIC CHOLECYSTECTOMY (N/A Abdomen) INDOCYANINE GREEN FLUORESCENCE IMAGING (ICG) (N/A Abdomen)  Patient Location: PACU  Anesthesia Type:General  Level of Consciousness: awake and patient cooperative  Airway & Oxygen Therapy: Patient Spontanous Breathing and Patient connected to face mask oxygen  Post-op Assessment: Report given to RN and Post -op Vital signs reviewed and stable  Post vital signs: Reviewed and stable  Last Vitals:  Vitals Value Taken Time  BP 131/90 10/30/20 1131  Temp 36.7 C 10/30/20 1131  Pulse 92 10/30/20 1141  Resp 20 10/30/20 1141  SpO2 100 % 10/30/20 1141  Vitals shown include unvalidated device data.  Last Pain:  Vitals:   10/30/20 1131  TempSrc:   PainSc: 0-No pain         Complications: No complications documented.

## 2020-10-30 NOTE — Anesthesia Procedure Notes (Signed)
Procedure Name: Intubation Performed by: Lynden Oxford, CRNA Pre-anesthesia Checklist: Patient identified, Emergency Drugs available, Suction available and Patient being monitored Patient Re-evaluated:Patient Re-evaluated prior to induction Oxygen Delivery Method: Circle system utilized Preoxygenation: Pre-oxygenation with 100% oxygen Induction Type: IV induction Ventilation: Mask ventilation without difficulty Laryngoscope Size: McGraph and 4 Grade View: Grade I Tube type: Oral Tube size: 7.5 mm Number of attempts: 1 Airway Equipment and Method: Stylet and Oral airway Placement Confirmation: ETT inserted through vocal cords under direct vision,  positive ETCO2 and breath sounds checked- equal and bilateral Secured at: 21 cm Tube secured with: Tape Dental Injury: Teeth and Oropharynx as per pre-operative assessment

## 2020-10-31 LAB — SURGICAL PATHOLOGY

## 2020-10-31 NOTE — Progress Notes (Signed)
10/31/20  Pathology results reviewed and released to patient via MyChart.  Chronic cholecystitis, no malignancy.  Patient has follow up on 11/15/20.  Henrene Dodge, MD

## 2020-10-31 NOTE — Anesthesia Postprocedure Evaluation (Signed)
Anesthesia Post Note  Patient: Suzanne [name]  Procedure(s) Performed: XI ROBOTIC ASSISTED LAPAROSCOPIC CHOLECYSTECTOMY (N/A Abdomen) INDOCYANINE GREEN FLUORESCENCE IMAGING (ICG) (N/A Abdomen)  Patient location during evaluation: PACU Anesthesia Type: General Level of consciousness: awake and alert Pain management: pain level controlled Vital Signs Assessment: post-procedure vital signs reviewed and stable Respiratory status: spontaneous breathing, nonlabored ventilation, respiratory function stable and patient connected to nasal cannula oxygen Cardiovascular status: blood pressure returned to baseline and stable Postop Assessment: no apparent nausea or vomiting Anesthetic complications: no   No complications documented.   Last Vitals:  Vitals:   10/30/20 1215 10/30/20 1230  BP: 131/89 129/89  Pulse: 86 77  Resp: 17 13  Temp: 36.6 C   SpO2: 100% 100%    Last Pain:  Vitals:   10/31/20 0834  TempSrc:   PainSc: 0-No pain                 Cleda Mccreedy Leland Staszewski

## 2020-11-15 ENCOUNTER — Ambulatory Visit (INDEPENDENT_AMBULATORY_CARE_PROVIDER_SITE_OTHER): Payer: 59 | Admitting: Physician Assistant

## 2020-11-15 ENCOUNTER — Other Ambulatory Visit: Payer: Self-pay

## 2020-11-15 ENCOUNTER — Encounter: Payer: Self-pay | Admitting: Physician Assistant

## 2020-11-15 VITALS — BP 108/72 | HR 95 | Temp 98.4°F | Ht 63.0 in | Wt 255.0 lb

## 2020-11-15 DIAGNOSIS — K8020 Calculus of gallbladder without cholecystitis without obstruction: Secondary | ICD-10-CM

## 2020-11-15 DIAGNOSIS — Z09 Encounter for follow-up examination after completed treatment for conditions other than malignant neoplasm: Secondary | ICD-10-CM

## 2020-11-15 NOTE — Patient Instructions (Signed)
GENERAL POST-OPERATIVE °PATIENT INSTRUCTIONS  ° °WOUND CARE INSTRUCTIONS:  Keep a dry clean dressing on the wound if there is drainage. The initial bandage may be removed after 24 hours.  Once the wound has quit draining you may leave it open to air.  If clothing rubs against the wound or causes irritation and the wound is not draining you may cover it with a dry dressing during the daytime.  Try to keep the wound dry and avoid ointments on the wound unless directed to do so.  If the wound becomes bright red and painful or starts to drain infected material that is not clear, please contact your physician immediately.  If the wound is mildly pink and has a thick firm ridge underneath it, this is normal, and is referred to as a healing ridge.  This will resolve over the next 4-6 weeks. ° °BATHING: °You may shower if you have been informed of this by your surgeon. However, Please do not submerge in a tub, hot tub, or pool until incisions are completely sealed or have been told by your surgeon that you may do so. ° °DIET:  You may eat any foods that you can tolerate.  It is a good idea to eat a high fiber diet and take in plenty of fluids to prevent constipation.  If you do become constipated you may want to take a mild laxative or take ducolax tablets on a daily basis until your bowel habits are regular.  Constipation can be very uncomfortable, along with straining, after recent surgery. ° °ACTIVITY:  You are encouraged to cough and deep breath or use your incentive spirometer if you were given one, every 15-30 minutes when awake.  This will help prevent respiratory complications and low grade fevers post-operatively if you had a general anesthetic.  You may want to hug a pillow when coughing and sneezing to add additional support to the surgical area, if you had abdominal or chest surgery, which will decrease pain during these times.  You are encouraged to walk and engage in light activity for the next two weeks.  You  should not lift more than 20 pounds for 6 weeks after surgery as it could put you at increased risk for complications.  Twenty pounds is roughly equivalent to a plastic bag of groceries. At that time- Listen to your body when lifting, if you have pain when lifting, stop and then try again in a few days. Soreness after doing exercises or activities of daily living is normal as you get back in to your normal routine. ° °MEDICATIONS:  Try to take narcotic medications and anti-inflammatory medications, such as tylenol, ibuprofen, naprosyn, etc., with food.  This will minimize stomach upset from the medication.  Should you develop nausea and vomiting from the pain medication, or develop a rash, please discontinue the medication and contact your physician.  You should not drive, make important decisions, or operate machinery when taking narcotic pain medication. ° °SUNBLOCK °Use sun block to incision area over the next year if this area will be exposed to sun. This helps decrease scarring and will allow you avoid a permanent darkened area over your incision. ° °QUESTIONS:  Please feel free to call our office if you have any questions, and we will be glad to assist you.  ° ° °

## 2020-11-15 NOTE — Progress Notes (Signed)
[facility] POST-OP OFFICE VISIT  11/15/2020  HPI: Suzanne [name] is a 30 y.o. female 16 days s/p robotic assisted laparoscopic cholecystectomy for symptomatic cholelithiasis with Dr Aleen Campi.   She has done very well Only had significant pain for a day or two. Not needing any pain medications currently No fever, chills, nausea, or emesis. No diarrhea and states "she actually feels more regular now."  No issues with the incisions No other complaints.   Vital signs: BP 108/72   Pulse 95   Temp 98.4 F (36.9 C)   Ht 5\' 3"  (1.6 m)   Wt 255 lb (115.7 kg)   LMP 11/05/2020   SpO2 95%   BMI 45.17 kg/m    Physical Exam: Constitutional: Well appearing female, NAD Abdomen: Soft, non-tender, non-distended, no rebound/guarding Skin: Laparoscopic incisions are well healed, no erythema or drainage   Assessment/Plan: This is a 30 y.o. female 16 days s/p robotic assisted laparoscopic cholecystectomy for symptomatic cholelithiasis   - Pain control prn  - Reviewed wound care  - Reviewed lifting restrictions; recommend completion of 4 weeks total. She is doing very well.   - Reviewed pathology: CCC, negative for malignancy   - Follow up on as needed basis   -- 38, PA-C [facility] 11/15/2020, 11:13 AM [phone] M-F: 7am - 4pm

## 2020-11-27 ENCOUNTER — Telehealth: Payer: Self-pay | Admitting: *Deleted

## 2020-11-27 NOTE — Telephone Encounter (Signed)
Placed pt's work note up front for pick up. Pt notified.

## 2020-11-27 NOTE — Telephone Encounter (Signed)
Needs to pick up a return to work note for June 14th  Patient had surgery on 10/30/20 Dr Dustin Folks

## 2021-11-27 IMAGING — US US ABDOMEN COMPLETE
1 series · 14 of 25 positions shown · non-contrast
Comparison: None.

CLINICAL DATA: Abdominal pain

EXAM:
ABDOMEN ULTRASOUND COMPLETE

[Series 1: us abdomen complete · 68 acquisitions, 14 frames shown]
[im 1/68]
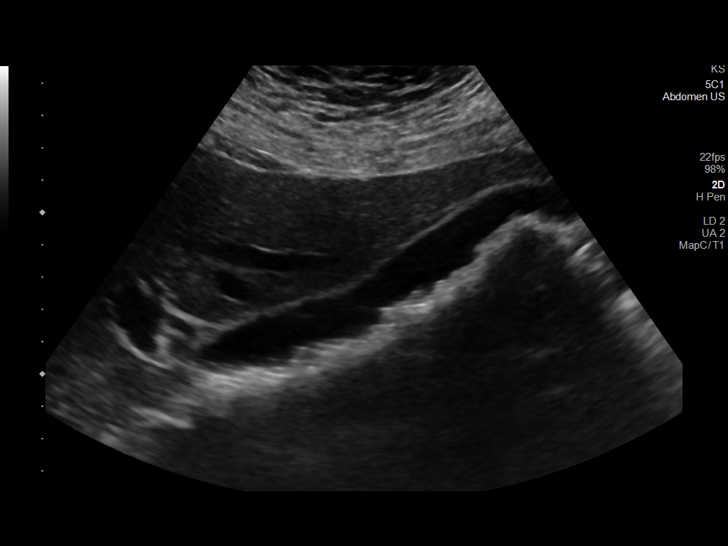
[im 6/68]
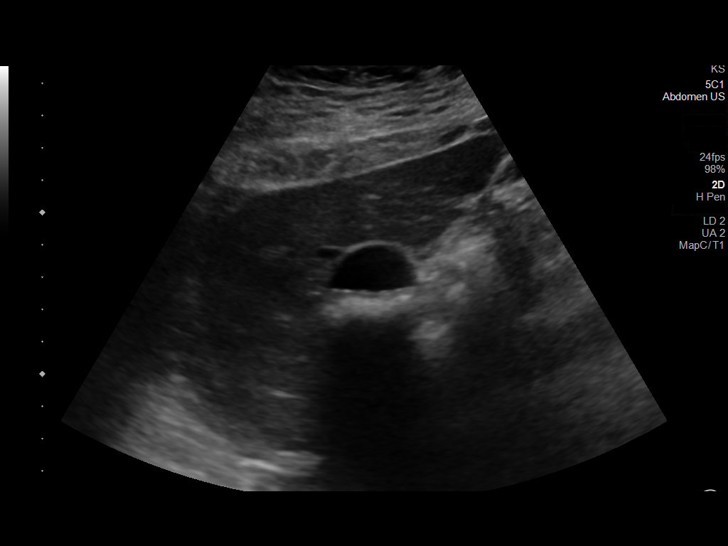
[im 12/68]
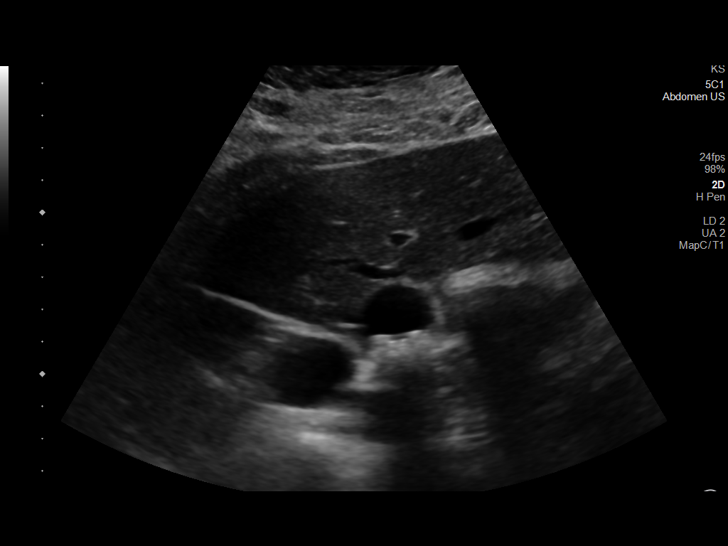
[im 17/68]
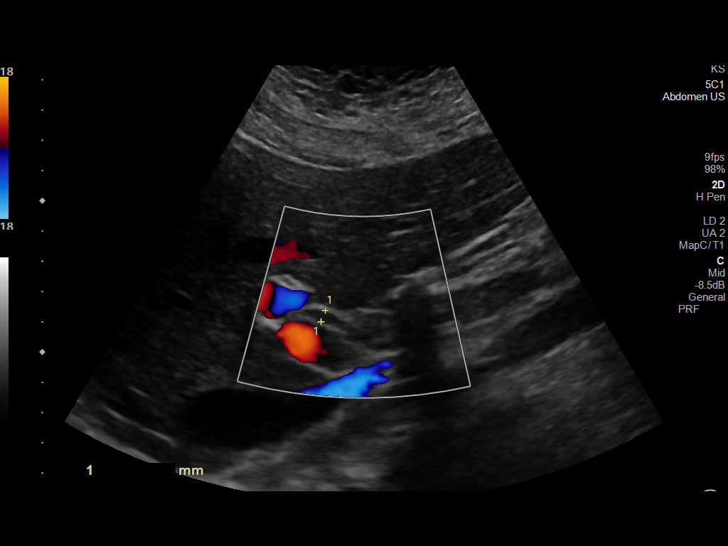
[im 23/68]
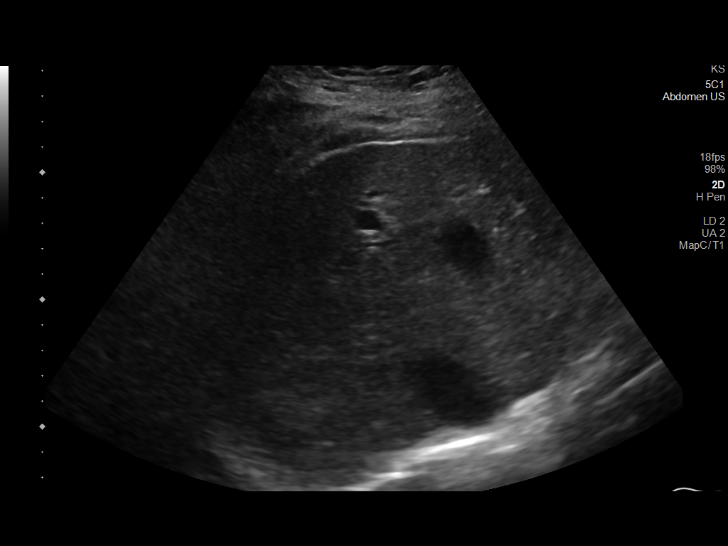
[im 26/68]
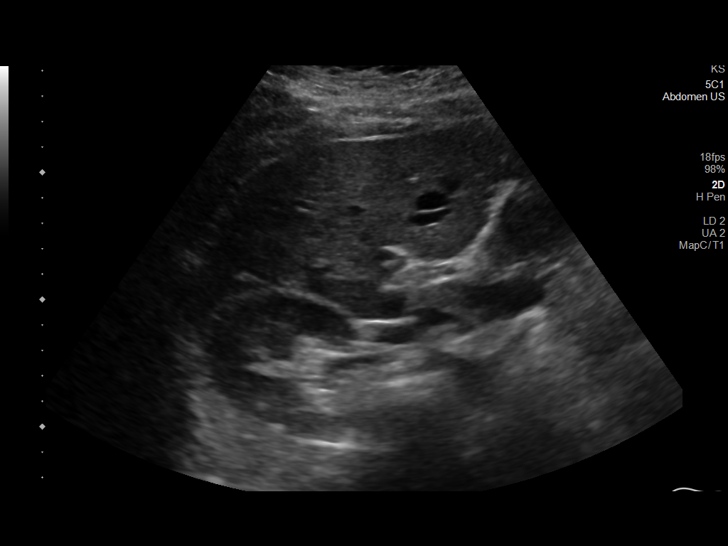
[im 31/68]
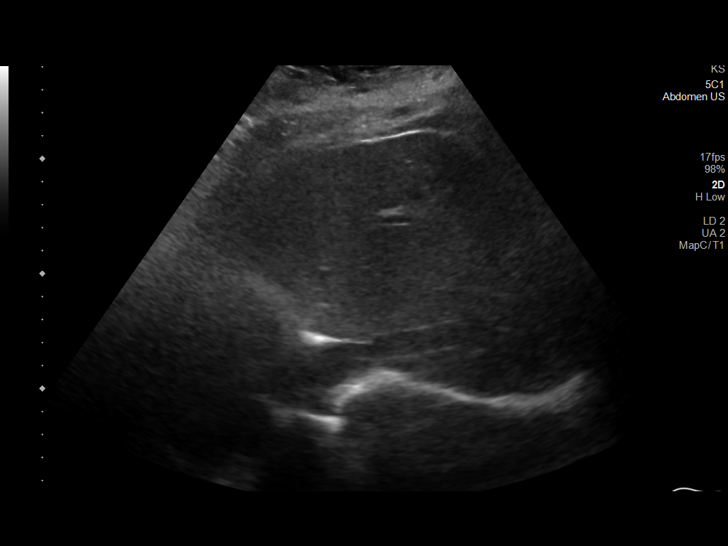
[im 37/68]
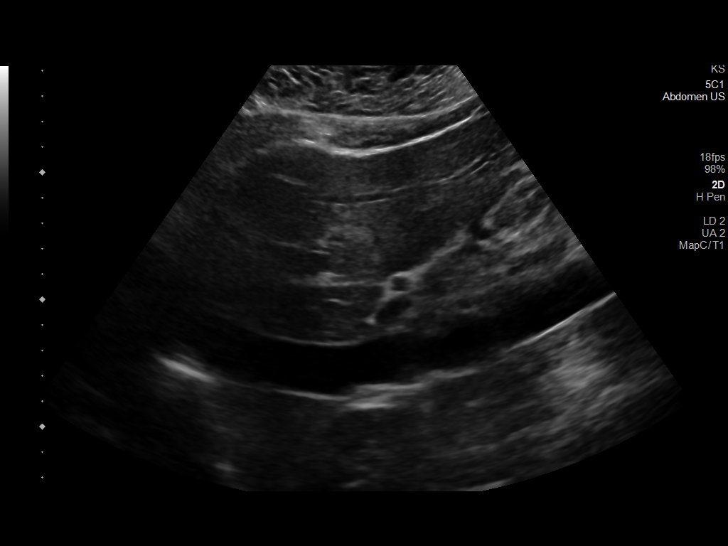
[im 42/68]
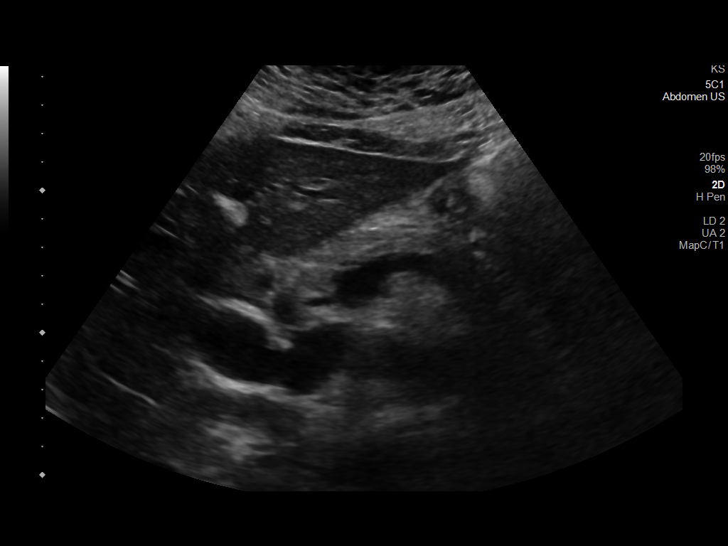
[im 45/68]
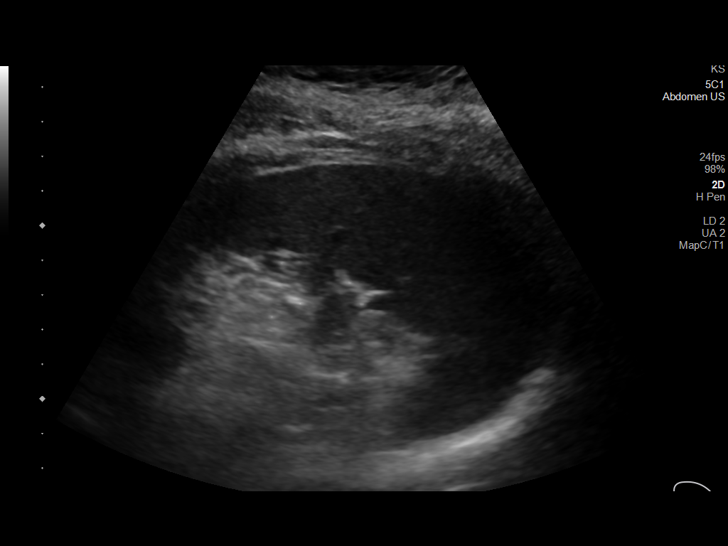
[im 51/68]
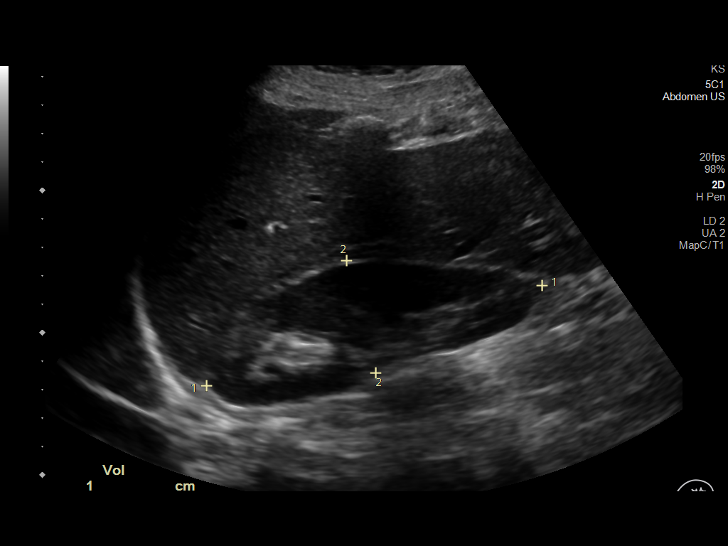
[im 56/68]
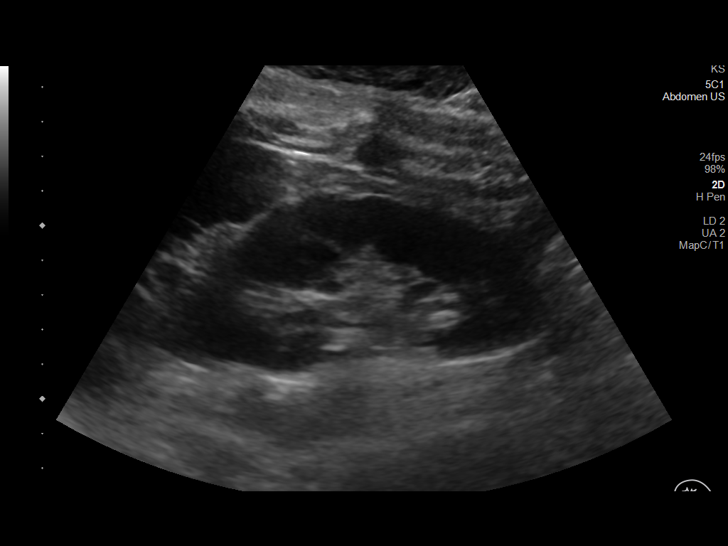
[im 62/68]
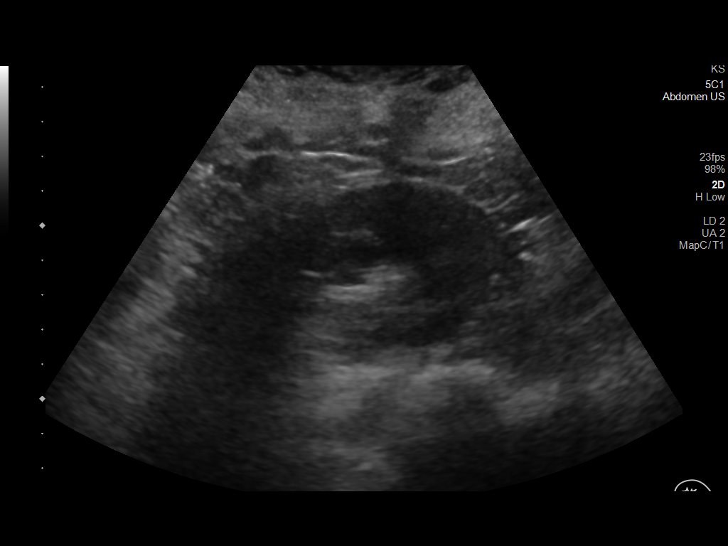
[im 68/68]
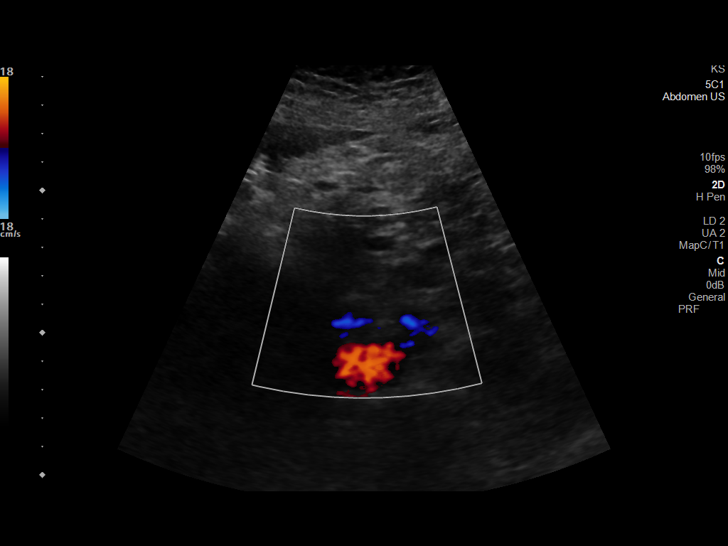

[14 of 25 positions shown; findings below may reference images not displayed]

FINDINGS: Gallbladder: Numerous small and layering gallstones. No wall
thickening or focal tenderness

Common bile duct: Diameter: 3-4 mm

Liver: No focal lesion identified. Within normal limits in
parenchymal echogenicity. Portal vein is patent on color Doppler
imaging with normal direction of blood flow towards the liver.

IVC: No abnormality visualized.

Pancreas: Visualized portion unremarkable.

Spleen: Size and appearance within normal limits.

Right Kidney: Length: 12 cm. Echogenicity within normal limits. No
mass or hydronephrosis visualized.

Left Kidney: Length: 11 cm. Echogenicity within normal limits. No
mass or hydronephrosis visualized.

Abdominal aorta: No aneurysm visualized.
IMPRESSION: Numerous small gallstones.  No evidence of acute cholecystitis.

## 2021-11-28 ENCOUNTER — Other Ambulatory Visit (HOSPITAL_COMMUNITY)
Admission: RE | Admit: 2021-11-28 | Discharge: 2021-11-28 | Disposition: A | Discharge status: Home / Self Care | Payer: 59 | Source: Ambulatory Visit | Attending: Radiology | Admitting: Radiology

## 2021-11-28 ENCOUNTER — Encounter: Payer: Self-pay | Admitting: Nurse Practitioner

## 2021-11-28 ENCOUNTER — Ambulatory Visit: Payer: 59 | Admitting: Nurse Practitioner

## 2021-11-28 ENCOUNTER — Ambulatory Visit (INDEPENDENT_AMBULATORY_CARE_PROVIDER_SITE_OTHER): Payer: 59 | Admitting: Radiology

## 2021-11-28 ENCOUNTER — Encounter: Payer: Self-pay | Admitting: Radiology

## 2021-11-28 VITALS — BP 122/78 | HR 104 | Temp 97.8°F | Ht 64.0 in | Wt 268.2 lb

## 2021-11-28 VITALS — BP 124/82 | Ht 63.75 in | Wt 266.0 lb

## 2021-11-28 DIAGNOSIS — E7800 Pure hypercholesterolemia, unspecified: Secondary | ICD-10-CM

## 2021-11-28 DIAGNOSIS — M546 Pain in thoracic spine: Secondary | ICD-10-CM

## 2021-11-28 DIAGNOSIS — Z0000 Encounter for general adult medical examination without abnormal findings: Secondary | ICD-10-CM

## 2021-11-28 DIAGNOSIS — Z01419 Encounter for gynecological examination (general) (routine) without abnormal findings: Secondary | ICD-10-CM

## 2021-11-28 DIAGNOSIS — E559 Vitamin D deficiency, unspecified: Secondary | ICD-10-CM | POA: Diagnosis not present

## 2021-11-28 DIAGNOSIS — R7301 Impaired fasting glucose: Secondary | ICD-10-CM | POA: Insufficient documentation

## 2021-11-28 DIAGNOSIS — R0981 Nasal congestion: Secondary | ICD-10-CM | POA: Diagnosis not present

## 2021-11-28 DIAGNOSIS — D649 Anemia, unspecified: Secondary | ICD-10-CM | POA: Diagnosis not present

## 2021-11-28 MED ORDER — LORATADINE 10 MG PO TABS: 10.0000 mg | ORAL_TABLET | Freq: Every day | ORAL | 1 refills | Status: AC

## 2021-11-28 MED ORDER — FLUTICASONE PROPIONATE 50 MCG/ACT NA SUSP: 2.0000 | Freq: Every day | NASAL | 6 refills | Status: AC

## 2021-11-28 NOTE — Patient Instructions (Signed)
It was great to see you!  Start a Claritin or zyrtec (loratadine or cetirizine) daily along with flonase nasal spray daily for your congestion and sinuses.   Start stretches daily for your back, you can keep taking tylenol as needed. You can also do massages or go to the chiropractor.   Let's follow-up in 1 year, sooner if you have concerns.  If a referral was placed today, you will be contacted for an appointment. Please note that routine referrals can sometimes take up to 3-4 weeks to process. Please call our office if you haven't heard anything after this time frame.  Take care,  Rodman Pickle, NP

## 2021-11-28 NOTE — Assessment & Plan Note (Signed)
LDL has been elevated in the past. Discussed nutrition and exercise. Check lipid panel today.

## 2021-11-28 NOTE — Progress Notes (Signed)
New Patient Office Visit  Subjective    Patient ID: Suzanne [name], female    DOB: 1991-02-02  Age: 31 y.o. MRN: [id number]  CC:  Chief Complaint  Patient presents with   Establish Care    NP- establish care.  No concerns.     HPI Suzanne [name] presents for new patient visit to establish care.  Introduced to Designer, jewellery role and practice setting.  All questions answered.  Discussed provider/patient relationship and expectations.  She has been having sinus congestion, especially near the right side of her nostril and cheek. She has also noticed pressure in her left ear. She doesn't take anything for sinuses. She has a palate expander and braces, but isn't sure if this is causing it. She also endorses a sore throat. This has been going on for about 2 weeks.   She has a lot of tension in her upper back and neck for the last month. She denies injury. It does not radiate. She has been having some increase in stress recently with her dad's diagnosis of dementia. 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 She takes tylenol as needed which helps with the pain.   Depression Screen done today:     11/28/2021    3:55 PM 10/03/2020    9:11 AM 08/30/2019    8:42 AM 08/03/2018   10:00 AM 08/28/2017    9:57 AM  Depression screen PHQ 2/9  Decreased Interest 0 0 0 0 0  Down, Depressed, Hopeless 0 0 0 0 0  PHQ - 2 Score 0 0 0 0 0  Altered sleeping  0 0 1   Tired, decreased energy  0 1 1   Change in appetite  0 0 1   Feeling bad or failure about yourself   0 0 1   Trouble concentrating  0 1 0   Moving slowly or fidgety/restless  0 1 0   Suicidal thoughts  0 0 0   PHQ-9 Score  0 3 4   Difficult doing work/chores   Not difficult at all     Outpatient Encounter Medications as of 11/28/2021  Medication Sig   acetaminophen (TYLENOL) 500 MG tablet Take 2 tablets (1,000 mg total) by mouth every 6 (six) hours as needed for mild pain.   fluticasone (FLONASE) 50 MCG/ACT nasal spray Place 2  sprays into both nostrils daily.   loratadine (CLARITIN) 10 MG tablet Take 1 tablet (10 mg total) by mouth daily.   [DISCONTINUED] ibuprofen (ADVIL) 800 MG tablet Take 1 tablet (800 mg total) by mouth every 8 (eight) hours as needed for moderate pain.   [DISCONTINUED] pantoprazole (PROTONIX) 40 MG tablet Take 1 tablet (40 mg total) by mouth daily.   No facility-administered encounter medications on file as of 11/28/2021.    Past Medical History:  Diagnosis Date   COVID-19    Obesity    Symptomatic cholelithiasis     Past Surgical History:  Procedure Laterality Date   CHOLECYSTECTOMY  10/2020   VAGINAL DELIVERY N/A 10/24/2016   Procedure: VAGINAL DELIVERY;  Surgeon: Cheri Fowler, MD;  Location: [facility];  Service: Obstetrics;  Laterality: N/A;   WISDOM TOOTH EXTRACTION      Family History  Problem Relation Age of Onset   Varicose Veins Mother    Hypertension Father    Diabetes Father    Dementia Father    Healthy Child    Healthy Child  Healthy Child    Healthy Child     Social History   Socioeconomic History   Marital status: Married    Spouse name: Suzanne [name]   Number of children: 4   Years of education: Not on file   Highest education level: Not on file  Occupational History   Not on file  Tobacco Use   Smoking status: Never   Smokeless tobacco: Never  Vaping Use   Vaping Use: Never used  Substance and Sexual Activity   Alcohol use: No   Drug use: No   Sexual activity: Yes    Partners: Male    Birth control/protection: Surgical    Comment: partner vasectomy  Other Topics Concern   Not on file  Social History Narrative   Not on file   Social Determinants of Health   Financial Resource Strain: Not on file  Food Insecurity: Not on file  Transportation Needs: Not on file  Physical Activity: Not on file  Stress: Not on file  Social Connections: Not on file  Intimate Partner Violence: Not on file    Review of Systems  Constitutional:  Negative.   HENT:  Positive for congestion, ear pain and sore throat.   Eyes: Negative.   Respiratory: Negative.    Cardiovascular: Negative.   Gastrointestinal: Negative.   Genitourinary: Negative.   Musculoskeletal:  Positive for back pain.  Skin: Negative.   Neurological: Negative.   Psychiatric/Behavioral: Negative.       Objective    BP 122/78   Pulse (!) 104   Temp 97.8 F (36.6 C) (Temporal)   Ht 5\' 4"  (1.626 m)   Wt 268 lb 3.2 oz (121.7 kg)   LMP 11/11/2021 (Exact Date)   SpO2 97%   BMI 46.04 kg/m   Physical Exam Vitals and nursing note reviewed.  Constitutional:      General: She is not in acute distress.    Appearance: Normal appearance.  HENT:     Head: Normocephalic and atraumatic.     Right Ear: Tympanic membrane, ear canal and external ear normal.     Left Ear: Tympanic membrane, ear canal and external ear normal.     Nose:     Right Sinus: No maxillary sinus tenderness or frontal sinus tenderness.     Left Sinus: No maxillary sinus tenderness or frontal sinus tenderness.     Mouth/Throat:     Mouth: Mucous membranes are moist.     Pharynx: Oropharynx is clear.  Eyes:     Conjunctiva/sclera: Conjunctivae normal.  Cardiovascular:     Rate and Rhythm: Normal rate and regular rhythm.     Pulses: Normal pulses.     Heart sounds: Normal heart sounds.  Pulmonary:     Effort: Pulmonary effort is normal.     Breath sounds: Normal breath sounds.  Abdominal:     Palpations: Abdomen is soft.     Tenderness: There is no abdominal tenderness.  Musculoskeletal:        General: Normal range of motion.     Cervical back: Normal range of motion and neck supple. No tenderness.     Right lower leg: No edema.     Left lower leg: No edema.  Lymphadenopathy:     Cervical: No cervical adenopathy.  Skin:    General: Skin is warm and dry.  Neurological:     General: No focal deficit present.     Mental Status: She is alert and oriented to person, place, and time.  Cranial Nerves: No cranial nerve deficit.     Coordination: Coordination normal.     Gait: Gait normal.  Psychiatric:        Mood and Affect: Mood normal.        Behavior: Behavior normal.        Thought Content: Thought content normal.        Judgment: Judgment normal.      Assessment & Plan:   Problem List Items Addressed This Visit       Endocrine   IFG (impaired fasting glucose)    Blood sugar elevated in the past, will check A1c today.        Relevant Orders   Hemoglobin A1c     Other   Obesity, morbid (HCC)    BMI 46. Information given on nutrition and exercise.        Vitamin D deficiency    History of low vitamin D, will check vitamin D levels and treat based on results.        Relevant Orders   VITAMIN D 25 Hydroxy (Vit-D Deficiency, Fractures)   Acute bilateral thoracic back pain    Acute upper thoracic back pain for 4 weeks. She can continue tylenol as needed for pain. Stretches given that she can do daily. She can also use heat/ice, chiropractor, and massages. Follow up if symptoms not improving.        Anemia    She has a history of slightly low hemoglobin several times in the past. Check CBC and iron panel today.        Relevant Orders   Iron, TIBC and Ferritin Panel   CBC with Differential/Platelet   Elevated LDL cholesterol level    LDL has been elevated in the past. Discussed nutrition and exercise. Check lipid panel today.        Relevant Orders   Lipid panel   Other Visit Diagnoses     Routine general medical examination at a health care facility    -  Primary   Health maintenance reviewed and updated. She went to GYN this morning and had PAP. Discussed nutrition and exercise. F/U 1 year   Relevant Orders   CBC with Differential/Platelet   Comprehensive metabolic panel   Sinus congestion       Ongoing x2 weeks. Will have her start flonase and loratadine 10mg  daily. Drink plenty of fluids. F/U if not improving.        LABORATORY TESTING:  - Pap smear: done elsewhere  IMMUNIZATIONS:   - Tdap: Tetanus vaccination status reviewed: last tetanus booster within 10 years. - Influenza: Postponed to flu season - Pneumovax: Not applicable - Prevnar: Not applicable - HPV: Up to date - Zostavax vaccine: Not applicable  SCREENING: -Mammogram: Not applicable  - Colonoscopy: Not applicable  - Bone Density: Not applicable  -Hearing Test: Not applicable  -Spirometry: Not applicable   PATIENT COUNSELING:   Advised to take 1 mg of folate supplement per day if capable of pregnancy.   Sexuality: Discussed sexually transmitted diseases, partner selection, use of condoms, avoidance of unintended pregnancy  and contraceptive alternatives.   Advised to avoid cigarette smoking.  I discussed with the patient that most people either abstain from alcohol or drink within safe limits (<=14/week and <=4 drinks/occasion for males, <=7/weeks and <= 3 drinks/occasion for females) and that the risk for alcohol disorders and other health effects rises proportionally with the number of drinks per week and how often a drinker exceeds daily limits.  Discussed cessation/primary prevention of drug use and availability of treatment for abuse.   Diet: Encouraged to adjust caloric intake to maintain  or achieve ideal body weight, to reduce intake of dietary saturated fat and total fat, to limit sodium intake by avoiding high sodium foods and not adding table salt, and to maintain adequate dietary potassium and calcium preferably from fresh fruits, vegetables, and low-fat dairy products.    stressed the importance of regular exercise  Injury prevention: Discussed safety belts, safety helmets, smoke detector, smoking near bedding or upholstery.   Dental health: Discussed importance of regular tooth brushing, flossing, and dental visits.    NEXT PREVENTATIVE PHYSICAL DUE IN 1 YEAR.   Return in about 1 year (around 11/29/2022) for CPE.    Charyl Dancer, NP

## 2021-11-28 NOTE — Assessment & Plan Note (Signed)
Blood sugar elevated in the past, will check A1c today.

## 2021-11-28 NOTE — Assessment & Plan Note (Signed)
She has a history of slightly low hemoglobin several times in the past. Check CBC and iron panel today.

## 2021-11-28 NOTE — Assessment & Plan Note (Signed)
BMI 46. Information given on nutrition and exercise.

## 2021-11-28 NOTE — Progress Notes (Signed)
Suzanne [name] 1990/12/11 [id number]   History:  31 y.o. G2P2 presents for annual exam as a new patient. No gyn concerns. Husband has had a vasectomy. She works as a Psychologist, sport and exercise at [employer]. Has appt with PCP for AEX this afternoon.   Gynecologic History Patient's last menstrual period was 11/11/2021 (exact date). Period Cycle (Days): 28 Period Duration (Days): 5 Period Pattern: Regular Menstrual Flow: Heavy (first 2 days) Dysmenorrhea: (!) Mild Dysmenorrhea Symptoms: Cramping Contraception/Family planning: vasectomy Sexually active: yes Last Pap: 09/2018. Results were: normal   Obstetric History OB History  Gravida Para Term Preterm AB Living  2 2 2  0 0 4  SAB IAB Ectopic Multiple Live Births  0 0 0 2 4    # Outcome Date GA Lbr Len/2nd Weight Sex Delivery Anes PTL Lv  2A Term 10/24/16 [redacted]w[redacted]d 01:48 / 00:23 6 lb 1.2 oz (2.756 kg) M Vag-Spont EPI  LIV  2B Term 10/24/16 [redacted]w[redacted]d 01:48 / 00:29 6 lb 0.7 oz (2.741 kg) M Vag-Spont EPI  LIV  1A Term 01/14/13 [redacted]w[redacted]d 06:39 / 02:22 5 lb 14 oz (2.665 kg) M Vag-Vacuum EPI  LIV  1B Term 01/14/13 [redacted]w[redacted]d 06:39 / 02:28 6 lb 0.1 oz (2.724 kg) F Vag-Spont EPI  LIV     The following portions of the patient's history were reviewed and updated as appropriate: allergies, current medications, past family history, past medical history, past social history, past surgical history, and problem list.  Review of Systems Pertinent items noted in HPI and remainder of comprehensive ROS otherwise negative.   Past medical history, past surgical history, family history and social history were all reviewed and documented in the EPIC chart.   Exam:  Vitals:   11/28/21 1059  BP: 124/82  Weight: 266 lb (120.7 kg)  Height: 5' 3.75" (1.619 m)   Body mass index is 46.02 kg/m.  General appearance:  Normal, obese Thyroid:  Symmetrical, normal in size, without palpable masses or nodularity. Respiratory  Auscultation:  Clear without wheezing or  rhonchi Cardiovascular  Auscultation:  Regular rate, without rubs, murmurs or gallops  Edema/varicosities:  Not grossly evident Abdominal  Soft,nontender, without masses, guarding or rebound.  Liver/spleen:  No organomegaly noted  Hernia:  None appreciated  Skin  Inspection:  Grossly normal Breasts: Examined lying and sitting.   Right: Without masses, retractions, nipple discharge or axillary adenopathy.   Left: Without masses, retractions, nipple discharge or axillary adenopathy. Genitourinary   Inguinal/mons:  Normal without inguinal adenopathy  External genitalia:  Normal appearing vulva with no masses, tenderness, or lesions  BUS/Urethra/Skene's glands:  Normal without masses or exudate  Vagina:  Normal appearing with normal color and discharge, no lesions  Cervix:  Normal appearing without discharge or lesions  Uterus:  Normal in size, shape and contour.  Mobile, nontender  Adnexa/parametria:     Rt: Normal in size, without masses or tenderness.   Lt: Normal in size, without masses or tenderness.  Anus and perineum: Normal   Patient informed chaperone available to be present for breast and pelvic exam. Patient has requested no chaperone to be present. Patient has been advised what will be completed during breast and pelvic exam.   Assessment/Plan:   1. Well woman exam with routine gynecological exam  - Cytology - PAP( [facility])     Discussed SBE, pap/mammo screening as directed/appropriate. Recommend 01/28/22 of exercise weekly, including weight bearing exercise. Encouraged the use of seatbelts and sunscreen. Return in 1 year for annual or as  needed.   Arlie Solomons B WHNP-BC 11:51 AM 11/28/2021

## 2021-11-28 NOTE — Assessment & Plan Note (Signed)
Acute upper thoracic back pain for 4 weeks. She can continue tylenol as needed for pain. Stretches given that she can do daily. She can also use heat/ice, chiropractor, and massages. Follow up if symptoms not improving.

## 2021-11-28 NOTE — Assessment & Plan Note (Signed)
History of low vitamin D, will check vitamin D levels and treat based on results. ?

## 2021-11-29 LAB — HEMOGLOBIN A1C: Hgb A1c MFr Bld: 5.7 % (ref 4.6–6.5)

## 2021-11-29 LAB — CBC WITH DIFFERENTIAL/PLATELET
Basophils Absolute: 0 10*3/uL (ref 0.0–0.1)
Basophils Relative: 0.4 % (ref 0.0–3.0)
Eosinophils Absolute: 0.4 10*3/uL (ref 0.0–0.7)
Eosinophils Relative: 5 % (ref 0.0–5.0)
HCT: 38 % (ref 36.0–46.0)
Hemoglobin: 12.5 g/dL (ref 12.0–15.0)
Lymphocytes Relative: 28.2 % (ref 12.0–46.0)
Lymphs Abs: 2.1 10*3/uL (ref 0.7–4.0)
MCHC: 32.7 g/dL (ref 30.0–36.0)
MCV: 77.9 fl — ABNORMAL LOW (ref 78.0–100.0)
Monocytes Absolute: 0.6 10*3/uL (ref 0.1–1.0)
Monocytes Relative: 8.1 % (ref 3.0–12.0)
Neutro Abs: 4.4 10*3/uL (ref 1.4–7.7)
Neutrophils Relative %: 58.3 % (ref 43.0–77.0)
Platelets: 307 10*3/uL (ref 150.0–400.0)
RBC: 4.88 Mil/uL (ref 3.87–5.11)
RDW: 16.3 % — ABNORMAL HIGH (ref 11.5–15.5)
WBC: 7.6 10*3/uL (ref 4.0–10.5)

## 2021-11-29 LAB — COMPREHENSIVE METABOLIC PANEL
ALT: 15 U/L (ref 0–35)
AST: 14 U/L (ref 0–37)
Albumin: 4 g/dL (ref 3.5–5.2)
Alkaline Phosphatase: 73 U/L (ref 39–117)
BUN: 13 mg/dL (ref 6–23)
CO2: 24 mEq/L (ref 19–32)
Calcium: 9.7 mg/dL (ref 8.4–10.5)
Chloride: 102 mEq/L (ref 96–112)
Creatinine, Ser: 1 mg/dL (ref 0.40–1.20)
GFR: 74.46 mL/min (ref >60.00)
Glucose, Bld: 87 mg/dL (ref 70–99)
Potassium: 3.8 mEq/L (ref 3.5–5.1)
Sodium: 134 mEq/L — ABNORMAL LOW (ref 135–145)
Total Bilirubin: 0.4 mg/dL (ref 0.2–1.2)
Total Protein: 7.6 g/dL (ref 6.0–8.3)

## 2021-11-29 LAB — LIPID PANEL
Cholesterol: 168 mg/dL (ref 0–200)
HDL: 45.9 mg/dL (ref >39.00)
LDL Cholesterol: 105 mg/dL — ABNORMAL HIGH (ref 0–99)
NonHDL: 121.64
Total CHOL/HDL Ratio: 4
Triglycerides: 83 mg/dL (ref 0.0–149.0)
VLDL: 16.6 mg/dL (ref 0.0–40.0)

## 2021-11-29 LAB — IRON,TIBC AND FERRITIN PANEL
%SAT: 11 % (calc) — ABNORMAL LOW (ref 16–45)
Ferritin: 14 ng/mL — ABNORMAL LOW (ref 16–154)
Iron: 44 ug/dL (ref 40–190)
TIBC: 398 mcg/dL (calc) (ref 250–450)

## 2021-11-29 LAB — VITAMIN D 25 HYDROXY (VIT D DEFICIENCY, FRACTURES): VITD: 22.71 ng/mL — ABNORMAL LOW (ref 30.00–100.00)

## 2021-12-02 LAB — CYTOLOGY - PAP
Comment: NEGATIVE
Diagnosis: NEGATIVE
High risk HPV: NEGATIVE

## 2021-12-13 ENCOUNTER — Encounter: Payer: Self-pay | Admitting: Nurse Practitioner

## 2022-09-02 ENCOUNTER — Inpatient Hospital Stay
Admission: RE | Admit: 2022-09-02 | Discharge: 2022-09-02 | Disposition: A | Discharge status: Home / Self Care | Payer: Commercial Managed Care - PPO | Source: Ambulatory Visit | Attending: Nurse Practitioner | Admitting: Nurse Practitioner

## 2022-09-02 ENCOUNTER — Ambulatory Visit: Payer: Commercial Managed Care - PPO

## 2022-09-03 ENCOUNTER — Ambulatory Visit
Admission: EM | Admit: 2022-09-03 | Discharge: 2022-09-03 | Disposition: A | Discharge status: Home / Self Care | Payer: BC Managed Care – PPO | Attending: Family Medicine | Admitting: Family Medicine

## 2022-09-03 ENCOUNTER — Ambulatory Visit (INDEPENDENT_AMBULATORY_CARE_PROVIDER_SITE_OTHER): Payer: BC Managed Care – PPO

## 2022-09-03 VITALS — BP 134/81 | HR 71 | Temp 98.1°F | Resp 17

## 2022-09-03 DIAGNOSIS — M25531 Pain in right wrist: Secondary | ICD-10-CM

## 2022-09-03 NOTE — Discharge Instructions (Addendum)
You were seen today for wrist pain.  Your xray was normal.  This is likely a tendonitis type injury.  We have given you a wrist splint to use for support.  I recommend you use motrin for pain, and you may use an ice pack as well.  If you continue with pain then please follow up with your primary care provider.

## 2022-09-03 NOTE — ED Provider Notes (Signed)
[facility]    CSN: [id number] Arrival date & time: 09/03/22  1501      History   Chief Complaint Chief Complaint  Patient presents with   Wrist Pain   Hand Pain    HPI Suzanne [name] is a 32 y.o. female.   Patient is having pain into her right wrist and radiates into the hand.   Last week she injured her wrist while at work.  She had to stop a child from doing something and he turned quickly.  Had immediate pain.  No swelling.   Has not improved over the week, but no worse either.  Taking motrin for pain       Past Medical History:  Diagnosis Date   COVID-19    Obesity    Symptomatic cholelithiasis     Patient Active Problem List   Diagnosis Date Noted   Acute bilateral thoracic back pain 11/28/2021   Anemia 11/28/2021   Elevated LDL cholesterol level 11/28/2021   IFG (impaired fasting glucose) 11/28/2021   Generalized anxiety disorder 10/11/2019   Vitamin D deficiency 10/11/2019   Obesity, morbid [facility])     Past Surgical History:  Procedure Laterality Date   CHOLECYSTECTOMY  10/2020   VAGINAL DELIVERY N/A 10/24/2016   Procedure: VAGINAL DELIVERY;  Surgeon: Cheri Fowler, MD;  Location: [place];  Service: Obstetrics;  Laterality: N/A;   WISDOM TOOTH EXTRACTION      OB History     Gravida  2   Para  2   Term  2   Preterm  0   AB  0   Living  4      SAB  0   IAB  0   Ectopic  0   Multiple  2   Live Births  4            Home Medications    Prior to Admission medications   Medication Sig Start Date End Date Taking? Authorizing Provider  acetaminophen (TYLENOL) 500 MG tablet Take 2 tablets (1,000 mg total) by mouth every 6 (six) hours as needed for mild pain. 10/30/20   Olean Ree, MD  fluticasone (FLONASE) 50 MCG/ACT nasal spray Place 2 sprays into both nostrils daily. 11/28/21   McElwee, Lauren A, NP  loratadine (CLARITIN) 10 MG tablet Take 1 tablet (10 mg total) by mouth daily. 11/28/21   McElwee, Scheryl Darter,  NP    Family History Family History  Problem Relation Age of Onset   Varicose Veins Mother    Hypertension Father    Diabetes Father    Dementia Father    Healthy Child    Healthy Child    Healthy Child    Healthy Child     Social History Social History   Tobacco Use   Smoking status: Never   Smokeless tobacco: Never  Vaping Use   Vaping Use: Never used  Substance Use Topics   Alcohol use: No   Drug use: No     Allergies   Tuberculin, ppd   Review of Systems Review of Systems  Constitutional: Negative.   HENT: Negative.    Respiratory: Negative.    Cardiovascular: Negative.   Gastrointestinal: Negative.   Skin: Negative.   Psychiatric/Behavioral: Negative.       Physical Exam Triage Vital Signs ED Triage Vitals [09/03/22 1524]  Enc Vitals Group     BP 134/81     Pulse Rate 71     Resp 17  Temp 98.1 F (36.7 C)     Temp Source Oral     SpO2 98 %     Weight      Height      Head Circumference      Peak Flow      Pain Score 4     Pain Loc      Pain Edu?      Excl. in [place]?    No data found.  Updated Vital Signs BP 134/81 (BP Location: Right Arm)   Pulse 71   Temp 98.1 F (36.7 C) (Oral)   Resp 17   LMP 08/22/2022   SpO2 98%   Visual Acuity Right Eye Distance:   Left Eye Distance:   Bilateral Distance:    Right Eye Near:   Left Eye Near:    Bilateral Near:     Physical Exam Constitutional:      Appearance: Normal appearance.  Cardiovascular:     Rate and Rhythm: Normal rate.  Pulmonary:     Effort: Pulmonary effort is normal.  Musculoskeletal:     Comments: No deformity noted to the right wrist;   TTP to the distal radius and radial aspect of the wrist;  Full rom but with pain.    Skin:    General: Skin is warm.  Neurological:     General: No focal deficit present.     Mental Status: She is alert.  Psychiatric:        Mood and Affect: Mood normal.      UC Treatments / Results  Labs (all labs ordered are listed,  but only abnormal results are displayed) Labs Reviewed - No data to display  EKG   Radiology DG Wrist Complete Right  Result Date: 09/03/2022 CLINICAL DATA:  Right wrist pain, no history of injury EXAM: RIGHT WRIST - COMPLETE 3+ VIEW COMPARISON:  None Available. FINDINGS: Frontal, oblique, lateral, and ulnar deviated views of the right wrist are obtained. No acute fracture, subluxation, or dislocation. Joint spaces are well preserved. Soft tissues are unremarkable. IMPRESSION: 1. Unremarkable right wrist. Electronically Signed   By: Randa Ngo M.D.   On: 09/03/2022 15:47    Procedures Procedures (including critical care time)  Medications Ordered in UC Medications - No data to display  Initial Impression / Assessment and Plan / UC Course  I have reviewed the triage vital signs and the nursing notes.  Pertinent labs & imaging results that were available during my care of the patient were reviewed by me and considered in my medical decision making (see chart for details).  Final Clinical Impressions(s) / UC Diagnoses   Final diagnoses:  Right wrist pain     Discharge Instructions      You were seen today for wrist pain.  Your xray was normal.  This is likely a tendonitis type injury.  We have given you a wrist splint to use for support.  I recommend you use motrin for pain, and you may use an ice pack as well.  If you continue with pain then please follow up with your primary care provider.     ED Prescriptions   None    PDMP not reviewed this encounter.   Rondel Oh, MD 09/03/22 [phone]

## 2022-09-03 NOTE — ED Triage Notes (Signed)
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

## 2022-12-01 ENCOUNTER — Encounter: Payer: BC Managed Care – PPO | Admitting: Nurse Practitioner

## 2022-12-01 ENCOUNTER — Telehealth: Payer: Self-pay | Admitting: Nurse Practitioner

## 2022-12-01 NOTE — Telephone Encounter (Signed)
Pt called at 2:15 to cancel her 3:00 appt said her tire went flat then told me she was in a lot of traffic. Letter sent.

## 2022-12-01 NOTE — Telephone Encounter (Signed)
Noted  

## 2022-12-04 NOTE — Telephone Encounter (Signed)
Noted  

## 2022-12-04 NOTE — Telephone Encounter (Signed)
same day cancel/flat tire, fee waived, letter sent

## 2022-12-17 ENCOUNTER — Ambulatory Visit: Payer: Commercial Managed Care - PPO | Admitting: Radiology

## 2022-12-29 ENCOUNTER — Ambulatory Visit: Payer: Commercial Managed Care - PPO | Admitting: Radiology

## 2023-01-22 ENCOUNTER — Encounter: Payer: Self-pay | Admitting: Nurse Practitioner

## 2023-01-22 ENCOUNTER — Ambulatory Visit (INDEPENDENT_AMBULATORY_CARE_PROVIDER_SITE_OTHER): Payer: BC Managed Care – PPO | Admitting: Nurse Practitioner

## 2023-01-22 ENCOUNTER — Other Ambulatory Visit: Payer: Self-pay | Admitting: Nurse Practitioner

## 2023-01-22 VITALS — BP 116/82 | HR 86 | Temp 98.4°F | Ht 64.0 in | Wt 268.8 lb

## 2023-01-22 DIAGNOSIS — H6992 Unspecified Eustachian tube disorder, left ear: Secondary | ICD-10-CM

## 2023-01-22 DIAGNOSIS — F411 Generalized anxiety disorder: Secondary | ICD-10-CM

## 2023-01-22 DIAGNOSIS — K219 Gastro-esophageal reflux disease without esophagitis: Secondary | ICD-10-CM

## 2023-01-22 MED ORDER — PANTOPRAZOLE SODIUM 40 MG PO TBEC: 40.0000 mg | DELAYED_RELEASE_TABLET | Freq: Every day | ORAL | 1 refills | Status: DC

## 2023-01-22 MED ORDER — SERTRALINE HCL 25 MG PO TABS: 25.0000 mg | ORAL_TABLET | Freq: Every day | ORAL | 1 refills | Status: DC

## 2023-01-22 MED ORDER — PREDNISONE 20 MG PO TABS: 40.0000 mg | ORAL_TABLET | Freq: Every day | ORAL | 0 refills | Status: DC

## 2023-01-22 NOTE — Assessment & Plan Note (Signed)
Chronic, not controlled.  She was taking Zantac and Alka-Seltzer which have not completely controlled her symptoms.  Will have her start pantoprazole 40 mg daily.  She took this in the past which helped with her symptoms.  Discussed limiting foods that trigger her symptoms and not laying down at night after eating.  Follow-up in 4 to 6 weeks.

## 2023-01-22 NOTE — Progress Notes (Signed)
Established Patient Office Visit  Subjective   Patient ID: Suzanne [name], female    DOB: 1991/03/03  Age: 32 y.o. MRN: [id number]  Chief Complaint  Patient presents with   Heartburn    Discuss issues with Anxiety, Fluid in left ear   HPI:  Suzanne [name] is here to discuss acid reflux and anxiety.   She states that her heartburn is getting worse. She notes that it happens with anything that she eats. She took some alkaseltzer which helped. A few years ago she was on medication for heartburn which helped. She also tried zantac which didn't help. She denies nausea and vomiting.   She feels like she has fluid in her left ear. She states that it started 2 weeks ago after getting her hair washed. She is having pressure in her ear that goes to the jaw. She denies fevers, drainage. She does have allergies and sinus issues. She is still taking flonase and claritin daily.   She feels like her anxiety worsened after her grandfather passed away 1 month ago. She states her dad was also placed on hospice. She is worried about what may happen next. She will sometime have pressure in her chest. She denies panic attacks. She has trouble sleeping. She talks to her mom and the hospice nurse. The hospice nurse tells her that they have resources that she can use. She has taken zoloft in the past during the pandemic with anxiety then.      01/22/2023    8:15 AM 11/28/2021    3:55 PM 10/03/2020    9:11 AM 08/30/2019    8:42 AM 08/03/2018   10:00 AM  Depression screen PHQ 2/9  Decreased Interest 0 0 0 0 0  Down, Depressed, Hopeless 0 0 0 0 0  PHQ - 2 Score 0 0 0 0 0  Altered sleeping 1  0 0 1  Tired, decreased energy 1  0 1 1  Change in appetite 1  0 0 1  Feeling bad or failure about yourself  0  0 0 1  Trouble concentrating 1  0 1 0  Moving slowly or fidgety/restless 0  0 1 0  Suicidal thoughts 0  0 0 0  PHQ-9 Score 4  0 3 4  Difficult doing work/chores Somewhat difficult   Not difficult at all        01/22/2023    8:15 AM 10/03/2020    9:11 AM 10/11/2019    9:10 AM 08/30/2019    8:42 AM  GAD 7 : Generalized Anxiety Score  Nervous, Anxious, on Edge 1 0 1 3  Control/stop worrying 1 0 0 3  Worry too much - different things 1 0 1 3  Trouble relaxing 1 0 1 3  Restless 0 0 0 1  Easily annoyed or irritable 1 0 0 3  Afraid - awful might happen 1 0 0 3  Total GAD 7 Score 6 0 3 19  Anxiety Difficulty Somewhat difficult   Very difficult      ROS See pertinent positives and negatives per HPI.   Objective:     BP 116/82 (BP Location: Left Arm)   Pulse 86   Temp 98.4 F (36.9 C) (Oral)   Ht 5\' 4"  (1.626 m)   Wt 268 lb 12.8 oz (121.9 kg)   LMP 01/05/2023   SpO2 96%   BMI 46.14 kg/m    Physical Exam Vitals and nursing note reviewed.  Constitutional:  General: She is not in acute distress.    Appearance: Normal appearance.  HENT:     Head: Normocephalic.     Right Ear: Tympanic membrane, ear canal and external ear normal.     Left Ear: Ear canal and external ear normal. A middle ear effusion is present.  Eyes:     Conjunctiva/sclera: Conjunctivae normal.  Cardiovascular:     Rate and Rhythm: Normal rate and regular rhythm.     Pulses: Normal pulses.     Heart sounds: Normal heart sounds.  Pulmonary:     Effort: Pulmonary effort is normal.     Breath sounds: Normal breath sounds.  Musculoskeletal:     Cervical back: Normal range of motion.  Skin:    General: Skin is warm.  Neurological:     General: No focal deficit present.     Mental Status: She is alert and oriented to person, place, and time.  Psychiatric:        Mood and Affect: Mood normal.        Behavior: Behavior normal.        Thought Content: Thought content normal.        Judgment: Judgment normal.      Assessment & Plan:   Problem List Items Addressed This Visit       Digestive   Gastroesophageal reflux disease    Chronic, not controlled.  She was taking Zantac and Alka-Seltzer which have not  completely controlled her symptoms.  Will have her start pantoprazole 40 mg daily.  She took this in the past which helped with her symptoms.  Discussed limiting foods that trigger her symptoms and not laying down at night after eating.  Follow-up in 4 to 6 weeks.      Relevant Medications   pantoprazole (PROTONIX) 40 MG tablet     Other   Generalized anxiety disorder - Primary    Chronic, not controlled.  She states that her anxiety has gotten worse over the last month after her grandfather passed away and her dad is in hospice.  She does have options available through hospice to talk to a counselor.  Her PHQ-9 is a 4 and her GAD-7 is a 6.  She declines SI/HI.  She was taking sertraline in the past during the pandemic when her anxiety had worsened then.  Will have her start sertraline 25 mg daily for 2 weeks and then increase to 50 mg daily.  Follow-up in 4 to 6 weeks.      Relevant Medications   sertraline (ZOLOFT) 25 MG tablet   Other Visit Diagnoses     Dysfunction of left eustachian tube       Treat with prednisone 40 mg daily x 5.  Take in the morning with food.  Continue Flonase and Claritin daily.       Return in about 4 weeks (around 02/19/2023) for 4-6 weeks , CPE.    Gerre Scull, NP

## 2023-01-22 NOTE — Assessment & Plan Note (Signed)
Chronic, not controlled.  She states that her anxiety has gotten worse over the last month after her grandfather passed away and her dad is in hospice.  She does have options available through hospice to talk to a counselor.  Her PHQ-9 is a 4 and her GAD-7 is a 6.  She declines SI/HI.  She was taking sertraline in the past during the pandemic when her anxiety had worsened then.  Will have her start sertraline 25 mg daily for 2 weeks and then increase to 50 mg daily.  Follow-up in 4 to 6 weeks.

## 2023-01-22 NOTE — Patient Instructions (Addendum)
It was great to see you!  Start protonix 1 tablet daily for your reflux  Start prednisone 2 tablets in your morning with food for 5 days for your ear. Keep taking the flonase and claritin   Start zoloft 1 tablet daily for 2 weeks, then you can increase to 2 tablets daily.   I recommend you start a multivitamin with iron and a vitamin D supplement 2,000 units daily   Let's follow-up in 4-6 weeks, sooner if you have concerns.  If a referral was placed today, you will be contacted for an appointment. Please note that routine referrals can sometimes take up to 3-4 weeks to process. Please call our office if you haven't heard anything after this time frame.  Take care,  Rodman Pickle, NP

## 2023-01-23 ENCOUNTER — Other Ambulatory Visit: Payer: Self-pay | Admitting: Nurse Practitioner

## 2023-01-23 MED ORDER — SERTRALINE HCL 50 MG PO TABS: 25.0000 mg | ORAL_TABLET | Freq: Every day | ORAL | 1 refills | Status: DC

## 2023-01-28 ENCOUNTER — Encounter (INDEPENDENT_AMBULATORY_CARE_PROVIDER_SITE_OTHER): Payer: Self-pay

## 2023-01-30 ENCOUNTER — Other Ambulatory Visit: Payer: Self-pay

## 2023-01-30 ENCOUNTER — Emergency Department (HOSPITAL_BASED_OUTPATIENT_CLINIC_OR_DEPARTMENT_OTHER): Payer: BC Managed Care – PPO | Admitting: Radiology

## 2023-01-30 ENCOUNTER — Encounter (HOSPITAL_BASED_OUTPATIENT_CLINIC_OR_DEPARTMENT_OTHER): Payer: Self-pay

## 2023-01-30 ENCOUNTER — Emergency Department (HOSPITAL_BASED_OUTPATIENT_CLINIC_OR_DEPARTMENT_OTHER)
Admission: EM | Admit: 2023-01-30 | Discharge: 2023-01-30 | Disposition: A | Discharge status: Home / Self Care | Payer: BC Managed Care – PPO | Attending: Emergency Medicine | Admitting: Emergency Medicine

## 2023-01-30 DIAGNOSIS — E876 Hypokalemia: Secondary | ICD-10-CM | POA: Insufficient documentation

## 2023-01-30 DIAGNOSIS — R0789 Other chest pain: Secondary | ICD-10-CM | POA: Insufficient documentation

## 2023-01-30 DIAGNOSIS — R079 Chest pain, unspecified: Secondary | ICD-10-CM | POA: Diagnosis present

## 2023-01-30 HISTORY — DX: Anxiety disorder, unspecified: F41.9

## 2023-01-30 LAB — CBC
HCT: 37.4 % (ref 36.0–46.0)
Hemoglobin: 12.1 g/dL (ref 12.0–15.0)
MCH: 24.2 pg — ABNORMAL LOW (ref 26.0–34.0)
MCHC: 32.4 g/dL (ref 30.0–36.0)
MCV: 74.9 fL — ABNORMAL LOW (ref 80.0–100.0)
Platelets: 390 10*3/uL (ref 150–400)
RBC: 4.99 MIL/uL (ref 3.87–5.11)
RDW: 16.1 % — ABNORMAL HIGH (ref 11.5–15.5)
WBC: 7.1 10*3/uL (ref 4.0–10.5)
nRBC: 0 % (ref 0.0–0.2)

## 2023-01-30 LAB — COMPREHENSIVE METABOLIC PANEL
ALT: 14 U/L (ref 0–44)
AST: 11 U/L — ABNORMAL LOW (ref 15–41)
Albumin: 4.2 g/dL (ref 3.5–5.0)
Alkaline Phosphatase: 68 U/L (ref 38–126)
Anion gap: 11 (ref 5–15)
BUN: 9 mg/dL (ref 6–20)
CO2: 24 mmol/L (ref 22–32)
Calcium: 9.5 mg/dL (ref 8.9–10.3)
Chloride: 101 mmol/L (ref 98–111)
Creatinine, Ser: 0.8 mg/dL (ref 0.44–1.00)
GFR, Estimated: >60 mL/min (ref >60)
Glucose, Bld: 95 mg/dL (ref 70–99)
Potassium: 3.2 mmol/L — ABNORMAL LOW (ref 3.5–5.1)
Sodium: 136 mmol/L (ref 135–145)
Total Bilirubin: 0.6 mg/dL (ref 0.3–1.2)
Total Protein: 7.5 g/dL (ref 6.5–8.1)

## 2023-01-30 LAB — LIPASE, BLOOD: Lipase: <10 U/L — ABNORMAL LOW (ref 11–51)

## 2023-01-30 LAB — TROPONIN I (HIGH SENSITIVITY): Troponin I (High Sensitivity): 4 ng/L (ref <18)

## 2023-01-30 LAB — PREGNANCY, URINE: Preg Test, Ur: NEGATIVE

## 2023-01-30 MED ORDER — ALUM & MAG HYDROXIDE-SIMETH 200-200-20 MG/5ML PO SUSP
30.0000 mL | Freq: Once | ORAL | Status: AC
  Administered 2023-01-30: 30 mL via ORAL
  Filled 2023-01-30: qty 30

## 2023-01-30 MED ORDER — POTASSIUM CHLORIDE CRYS ER 20 MEQ PO TBCR
20.0000 meq | EXTENDED_RELEASE_TABLET | Freq: Once | ORAL | Status: AC
  Administered 2023-01-30: 20 meq via ORAL
  Filled 2023-01-30: qty 1

## 2023-01-30 NOTE — ED Provider Notes (Signed)
[place] [facility] AT [facility] Provider Note   CSN: [id number] Arrival date & time: 01/30/23  4696     History  Chief Complaint  Patient presents with   Chest Pain    Suzanne [name] is a 32 y.o. female with a past medical history significant for GERD and anxiety who presents to the ED due to pressure-like sensation to center chest x4 days.  No cardiac history.  Recently placed on Protonix for GERD on 7/25.  States it feels similar to her reflux in the past however, now it is persistent not just after eating or when lying flat.  Denies history of blood clots, recent surgeries, recent long immobilizations, or hormonal treatments.  No lower extremity edema.  Denies upper abdominal pain.  History of cholecystectomy in 2022.  No shortness of breath. Also admits to worsening anxiety. Placed on Zoloft by PCP. No SI/HI. No halluciinations.  History obtained from patient and past medical records. No interpreter used during encounter.       Home Medications Prior to Admission medications   Medication Sig Start Date End Date Taking? Authorizing Provider  fluticasone (FLONASE) 50 MCG/ACT nasal spray Place 2 sprays into both nostrils daily. 11/28/21   McElwee, Lauren A, NP  loratadine (CLARITIN) 10 MG tablet Take 1 tablet (10 mg total) by mouth daily. 11/28/21   McElwee, Lauren A, NP  pantoprazole (PROTONIX) 40 MG tablet Take 1 tablet (40 mg total) by mouth daily. 01/22/23   McElwee, Jake Church, NP  predniSONE (DELTASONE) 20 MG tablet Take 2 tablets (40 mg total) by mouth daily with breakfast. 01/22/23   McElwee, Lauren A, NP  sertraline (ZOLOFT) 50 MG tablet Take 0.5-1 tablets (25-50 mg total) by mouth daily. Take 1/2 tablet for 2 weeks, then increase to 1 tablet daily. 01/23/23   McElwee, Jake Church, NP      Allergies    Tuberculin, ppd    Review of Systems   Review of Systems  Respiratory:  Negative for shortness of breath.   Cardiovascular:  Positive for chest pain. Negative  for leg swelling.  Psychiatric/Behavioral:  The patient is nervous/anxious.     Physical Exam Updated Vital Signs BP 115/74   Pulse 67   Temp 98.3 F (36.8 C)   Resp 19   Ht 5\' 3"  (1.6 m)   Wt 121.6 kg   LMP 01/05/2023   SpO2 99%   BMI 47.47 kg/m  Physical Exam Vitals and nursing note reviewed.  Constitutional:      General: She is not in acute distress.    Appearance: She is not ill-appearing.  HENT:     Head: Normocephalic.  Eyes:     Pupils: Pupils are equal, round, and reactive to light.  Cardiovascular:     Rate and Rhythm: Normal rate and regular rhythm.     Pulses: Normal pulses.     Heart sounds: Normal heart sounds. No murmur heard.    No friction rub. No gallop.  Pulmonary:     Effort: Pulmonary effort is normal.     Breath sounds: Normal breath sounds.  Abdominal:     General: Abdomen is flat. There is no distension.     Palpations: Abdomen is soft.     Tenderness: There is no abdominal tenderness. There is no guarding or rebound.  Musculoskeletal:        General: Normal range of motion.     Cervical back: Neck supple.  Skin:    General: Skin is  warm and dry.  Neurological:     General: No focal deficit present.     Mental Status: She is alert.  Psychiatric:        Mood and Affect: Mood normal.        Behavior: Behavior normal.     ED Results / Procedures / Treatments   Labs (all labs ordered are listed, but only abnormal results are displayed) Labs Reviewed  CBC - Abnormal; Notable for the following components:      Result Value   MCV 74.9 (*)    MCH 24.2 (*)    RDW 16.1 (*)    All other components within normal limits  COMPREHENSIVE METABOLIC PANEL - Abnormal; Notable for the following components:   Potassium 3.2 (*)    AST 11 (*)    All other components within normal limits  LIPASE, BLOOD - Abnormal; Notable for the following components:   Lipase <10 (*)    All other components within normal limits  PREGNANCY, URINE  TROPONIN I (HIGH  SENSITIVITY)    EKG EKG Interpretation Date/Time:  Friday January 30 2023 09:11:37 EDT Ventricular Rate:  113 PR Interval:  142 QRS Duration:  78 QT Interval:  324 QTC Calculation: 444 R Axis:   65  Text Interpretation: Sinus tachycardia Nonspecific ST and T wave abnormality Abnormal ECG When compared with ECG of 24-Aug-2019 09:59, PREVIOUS ECG IS PRESENT No significant change since last tracing Confirmed by Vanetta Mulders [phone]) on 01/30/2023 9:14:24 AM  Radiology DG Chest 2 View  Result Date: 01/30/2023 CLINICAL DATA:  Chest pain EXAM: CHEST - 2 VIEW COMPARISON:  X-ray 08/24/2019 FINDINGS: The heart size and mediastinal contours are within normal limits. Both lungs are clear. No consolidation, pneumothorax or effusion. No edema. The visualized skeletal structures are unremarkable. Degenerative changes seen of the spine. IMPRESSION: No acute cardiopulmonary disease. Electronically Signed   By: Karen Kays M.D.   On: 01/30/2023 10:24    Procedures Procedures    Medications Ordered in ED Medications  potassium chloride SA (KLOR-CON M) CR tablet 20 mEq (20 mEq Oral Given 01/30/23 1041)  alum & mag hydroxide-simeth (MAALOX/MYLANTA) 200-200-20 MG/5ML suspension 30 mL (30 mLs Oral Given 01/30/23 1041)    ED Course/ Medical Decision Making/ A&P                                 Medical Decision Making Amount and/or Complexity of Data Reviewed External Data Reviewed: notes.    Details: PCP  note Labs: ordered. Decision-making details documented in ED Course. Radiology: ordered and independent interpretation performed. Decision-making details documented in ED Course. ECG/medicine tests: ordered and independent interpretation performed. Decision-making details documented in ED Course.  Risk OTC drugs. Prescription drug management.   This patient presents to the ED for concern of CP, this involves an extensive number of treatment options, and is a complaint that carries with it a high  risk of complications and morbidity.  The differential diagnosis includes ACS, PE, aortic dissection, GERD, MSK etiology, etc  32 year old female presents to the ED due to chest pain x 4 days.  No cardiac history.  Recently placed on Protonix for GERD.  No history of blood clots.  No lower extremity edema.  Denies shortness of breath.  Upon arrival patient initially tachycardic however, heart rate rechecked 4 minutes later which normalized to 93.  Patient in no acute distress.  Reassuring physical exam.  No lower  extremity edema.  Lungs clear to auscultation bilaterally.  Abdomen soft, nondistended, nontender.  No tenderness to epigastric region.  Routine labs ordered.  Cardiac labs to rule out ACS however my suspicion is low.  Possible multifactorial with reflux and anxiety.  Denies SI/HI.  Currently on medication for both GERD and anxiety.  No evidence of DVT on exam.  No risk factors for blood clots.  Low suspicion for PE/DVT.  CBC reassuring.  No leukocytosis.  Normal hemoglobin.  CMP significant for hypokalemia at 3.2.  Normal renal function.  Potassium repleted.  Troponin normal.  EKG sinus tachycardia with nonspecific ST and T wave abnormalities.  No major changes from previous EKG.  Low suspicion for ACS.  Chest x-ray personally reviewed and interpreted which is negative for signs of pneumonia, pneumothorax or widened mediastinum.  Reassessed patient after GI cocktail.  Chest pain has completely resolved.  Suspect chest pain possibly secondary to GERD.  Heart rate has remained normal during her entire ED stay except when she initially checked in.  Patient states she felt anxious while walking in.  Low suspicion for PE/DVT.  Presentation nonconcerning for aortic dissection.  Advised patient to continue taking her Protonix as previously prescribed.  Follow-up with PCP in 2 to 3 days or return for worsening symptoms.  Patient stable for discharge. Strict ED precautions discussed with patient. Patient  states understanding and agrees to plan. Patient discharged home in no acute distress and stable vitals  Lives at home Has PCP Hx anxiety       Final Clinical Impression(s) / ED Diagnoses Final diagnoses:  Atypical chest pain    Rx / DC Orders ED Discharge Orders     None         Jesusita Oka 01/30/23 1102    Vanetta Mulders, MD 01/30/23 1503

## 2023-01-30 NOTE — Discharge Instructions (Signed)
It was a pleasure taking care of you today.  As discussed, your labs are reassuring.  Continue taking your Protonix as previously prescribed.  Please follow-up with PCP on Monday for recheck.  Return to the ER for any new or worsening symptoms.

## 2023-01-30 NOTE — ED Triage Notes (Signed)
Pt presents with mid-sternal CP described as a pressure that started on Monday. Pt reports worsening of pain after meals. Pt denies ShOB. Pt was seen by PCP and prescribed Protonix without relief.

## 2023-02-19 ENCOUNTER — Encounter: Payer: Self-pay | Admitting: Nurse Practitioner

## 2023-02-19 ENCOUNTER — Ambulatory Visit (INDEPENDENT_AMBULATORY_CARE_PROVIDER_SITE_OTHER): Payer: BC Managed Care – PPO | Admitting: Nurse Practitioner

## 2023-02-19 VITALS — BP 118/84 | HR 81 | Temp 97.8°F | Ht 63.0 in | Wt 268.6 lb

## 2023-02-19 DIAGNOSIS — E7800 Pure hypercholesterolemia, unspecified: Secondary | ICD-10-CM

## 2023-02-19 DIAGNOSIS — K219 Gastro-esophageal reflux disease without esophagitis: Secondary | ICD-10-CM

## 2023-02-19 DIAGNOSIS — R7303 Prediabetes: Secondary | ICD-10-CM

## 2023-02-19 DIAGNOSIS — Z0000 Encounter for general adult medical examination without abnormal findings: Secondary | ICD-10-CM | POA: Diagnosis not present

## 2023-02-19 DIAGNOSIS — F411 Generalized anxiety disorder: Secondary | ICD-10-CM

## 2023-02-19 DIAGNOSIS — D500 Iron deficiency anemia secondary to blood loss (chronic): Secondary | ICD-10-CM | POA: Diagnosis not present

## 2023-02-19 DIAGNOSIS — E559 Vitamin D deficiency, unspecified: Secondary | ICD-10-CM

## 2023-02-19 MED ORDER — SERTRALINE HCL 50 MG PO TABS: 50.0000 mg | ORAL_TABLET | Freq: Every day | ORAL | 3 refills | Status: AC

## 2023-02-19 NOTE — Assessment & Plan Note (Signed)
Health maintenance reviewed and updated. Discussed nutrition, exercise.  Recent CMP, CBC reviewed.  Follow-up 1 year.

## 2023-02-19 NOTE — Assessment & Plan Note (Signed)
BMI 47.5.  Information printed on healthy eating and exercise.

## 2023-02-19 NOTE — Assessment & Plan Note (Signed)
Hemoglobin was normal a few weeks ago, will check ferritin today.  Continue taking iron supplement daily.

## 2023-02-19 NOTE — Assessment & Plan Note (Signed)
Chronic, stable.  Continue sertraline 50 mg daily.  She states that this is working really well for her.  Follow-up in 1 year or sooner with concerns.

## 2023-02-19 NOTE — Assessment & Plan Note (Signed)
Chronic, stable Continue Protonix 40 mg daily

## 2023-02-19 NOTE — Assessment & Plan Note (Signed)
Check A1c and treat based on results. 

## 2023-02-19 NOTE — Progress Notes (Signed)
BP 118/84 (BP Location: Left Arm)   Pulse 81   Temp 97.8 F (36.6 C)   Ht 5\' 3"  (1.6 m)   Wt 268 lb 9.6 oz (121.8 kg)   LMP 01/31/2023 (Exact Date)   SpO2 100%   BMI 47.58 kg/m    Subjective:    Patient ID: Suzanne [name], female    DOB: 1990/08/10, 32 y.o.   MRN: [id number]  CC: Chief Complaint  Patient presents with   Annual Exam    No concerns    HPI: Suzanne [name] is a 32 y.o. female presenting on 02/19/2023 for comprehensive medical examination. Current medical complaints include:none  She currently lives with: husband, children Menopausal Symptoms: no  Depression and Anxiety Screen done today and results listed below:     02/19/2023    3:20 PM 01/22/2023    8:15 AM 11/28/2021    3:55 PM 10/03/2020    9:11 AM 08/30/2019    8:42 AM  Depression screen PHQ 2/9  Decreased Interest 0 0 0 0 0  Down, Depressed, Hopeless 0 0 0 0 0  PHQ - 2 Score 0 0 0 0 0  Altered sleeping 0 1  0 0  Tired, decreased energy 0 1  0 1  Change in appetite 0 1  0 0  Feeling bad or failure about yourself  0 0  0 0  Trouble concentrating 0 1  0 1  Moving slowly or fidgety/restless 0 0  0 1  Suicidal thoughts 0 0  0 0  PHQ-9 Score 0 4  0 3  Difficult doing work/chores  Somewhat difficult   Not difficult at all      02/19/2023    3:20 PM 01/22/2023    8:15 AM 10/03/2020    9:11 AM 10/11/2019    9:10 AM  GAD 7 : Generalized Anxiety Score  Nervous, Anxious, on Edge 1 1 0 1  Control/stop worrying 0 1 0 0  Worry too much - different things 1 1 0 1  Trouble relaxing 0 1 0 1  Restless 0 0 0 0  Easily annoyed or irritable 0 1 0 0  Afraid - awful might happen 0 1 0 0  Total GAD 7 Score 2 6 0 3  Anxiety Difficulty Not difficult at all Somewhat difficult      The patient does not have a history of falls. I did not complete a risk assessment for falls. A plan of care for falls was not documented.   Past Medical History:  Past Medical History:  Diagnosis Date   Anxiety    COVID-19    Obesity     Symptomatic cholelithiasis     Surgical History:  Past Surgical History:  Procedure Laterality Date   CHOLECYSTECTOMY  10/2020   VAGINAL DELIVERY N/A 10/24/2016   Procedure: VAGINAL DELIVERY;  Surgeon: Lavina Hamman, MD;  Location: [facility] BIRTHING SUITES;  Service: Obstetrics;  Laterality: N/A;   WISDOM TOOTH EXTRACTION      Medications:  Current Outpatient Medications on File Prior to Visit  Medication Sig   fluticasone (FLONASE) 50 MCG/ACT nasal spray Place 2 sprays into both nostrils daily.   loratadine (CLARITIN) 10 MG tablet Take 1 tablet (10 mg total) by mouth daily.   pantoprazole (PROTONIX) 40 MG tablet Take 1 tablet (40 mg total) by mouth daily.   No current facility-administered medications on file prior to visit.    Allergies:  Allergies  Allergen Reactions   Tuberculin, Ppd  Itching and Rash    Social History:  Social History   Socioeconomic History   Marital status: Married    Spouse name: Minerva Areola   Number of children: 4   Years of education: Not on file   Highest education level: Some college, no degree  Occupational History   Not on file  Tobacco Use   Smoking status: Never   Smokeless tobacco: Never  Vaping Use   Vaping status: Never Used  Substance and Sexual Activity   Alcohol use: No   Drug use: No   Sexual activity: Yes    Partners: Male    Birth control/protection: Surgical    Comment: partner vasectomy  Other Topics Concern   Not on file  Social History Narrative   Not on file   Social Determinants of Health   Financial Resource Strain: Low Risk  (01/20/2023)   Overall Financial Resource Strain (CARDIA)    Difficulty of Paying Living Expenses: Not hard at all  Food Insecurity: No Food Insecurity (01/20/2023)   Hunger Vital Sign    Worried About Running Out of Food in the Last Year: Never true    Ran Out of Food in the Last Year: Never true  Transportation Needs: No Transportation Needs (01/20/2023)   PRAPARE - Scientist, research (physical sciences) (Medical): No    Lack of Transportation (Non-Medical): No  Physical Activity: Unknown (01/20/2023)   Exercise Vital Sign    Days of Exercise per Week: 0 days    Minutes of Exercise per Session: Not on file  Stress: Stress Concern Present (01/20/2023)   [employer] of Occupational Health - Occupational Stress Questionnaire    Feeling of Stress : Rather much  Social Connections: Moderately Integrated (01/20/2023)   Social Connection and Isolation Panel [NHANES]    Frequency of Communication with Friends and Family: More than three times a week    Frequency of Social Gatherings with Friends and Family: Three times a week    Attends Religious Services: 1 to 4 times per year    Active Member of Clubs or Organizations: No    Attends Engineer, structural: Not on file    Marital Status: Married  Catering manager Violence: Not on file   Social History   Tobacco Use  Smoking Status Never  Smokeless Tobacco Never   Social History   Substance and Sexual Activity  Alcohol Use No    Family History:  Family History  Problem Relation Age of Onset   Varicose Veins Mother    Hypertension Father    Diabetes Father    Dementia Father    Healthy Child    Healthy Child    Healthy Child    Healthy Child     Past medical history, surgical history, medications, allergies, family history and social history reviewed with patient today and changes made to appropriate areas of the chart.   Review of Systems  Constitutional: Negative.   HENT: Negative.    Eyes: Negative.   Respiratory: Negative.    Cardiovascular: Negative.   Gastrointestinal: Negative.   Genitourinary: Negative.   Musculoskeletal: Negative.   Skin: Negative.   Neurological: Negative.   Psychiatric/Behavioral: Negative.     All other ROS negative except what is listed above and in the HPI.      Objective:    BP 118/84 (BP Location: Left Arm)   Pulse 81   Temp 97.8 F (36.6 C)   Ht 5'  3" (1.6 m)  Wt 268 lb 9.6 oz (121.8 kg)   LMP 01/31/2023 (Exact Date)   SpO2 100%   BMI 47.58 kg/m   Wt Readings from Last 3 Encounters:  02/19/23 268 lb 9.6 oz (121.8 kg)  01/30/23 268 lb (121.6 kg)  01/22/23 268 lb 12.8 oz (121.9 kg)    Physical Exam Vitals and nursing note reviewed.  Constitutional:      General: She is not in acute distress.    Appearance: Normal appearance. She is obese.  HENT:     Head: Normocephalic and atraumatic.     Right Ear: Tympanic membrane, ear canal and external ear normal.     Left Ear: Tympanic membrane, ear canal and external ear normal.  Eyes:     Conjunctiva/sclera: Conjunctivae normal.  Cardiovascular:     Rate and Rhythm: Normal rate and regular rhythm.     Pulses: Normal pulses.     Heart sounds: Normal heart sounds.  Pulmonary:     Effort: Pulmonary effort is normal.     Breath sounds: Normal breath sounds.  Abdominal:     Palpations: Abdomen is soft.     Tenderness: There is no abdominal tenderness.  Musculoskeletal:        General: Normal range of motion.     Cervical back: Normal range of motion and neck supple.     Right lower leg: No edema.     Left lower leg: No edema.  Lymphadenopathy:     Cervical: No cervical adenopathy.  Skin:    General: Skin is warm and dry.  Neurological:     General: No focal deficit present.     Mental Status: She is alert and oriented to person, place, and time.     Cranial Nerves: No cranial nerve deficit.     Coordination: Coordination normal.     Gait: Gait normal.  Psychiatric:        Mood and Affect: Mood normal.        Behavior: Behavior normal.        Thought Content: Thought content normal.        Judgment: Judgment normal.     Results for orders placed or performed during the hospital encounter of 01/30/23  CBC  Result Value Ref Range   WBC 7.1 4.0 - 10.5 K/uL   RBC 4.99 3.87 - 5.11 MIL/uL   Hemoglobin 12.1 12.0 - 15.0 g/dL   HCT 96.0 45.4 - 09.8 %   MCV 74.9 (L) 80.0 -  100.0 fL   MCH 24.2 (L) 26.0 - 34.0 pg   MCHC 32.4 30.0 - 36.0 g/dL   RDW 11.9 (H) 14.7 - 82.9 %   Platelets 390 150 - 400 K/uL   nRBC 0.0 0.0 - 0.2 %  Pregnancy, urine  Result Value Ref Range   Preg Test, Ur NEGATIVE NEGATIVE  Comprehensive metabolic panel  Result Value Ref Range   Sodium 136 135 - 145 mmol/L   Potassium 3.2 (L) 3.5 - 5.1 mmol/L   Chloride 101 98 - 111 mmol/L   CO2 24 22 - 32 mmol/L   Glucose, Bld 95 70 - 99 mg/dL   BUN 9 6 - 20 mg/dL   Creatinine, Ser 5.62 0.44 - 1.00 mg/dL   Calcium 9.5 8.9 - 13.0 mg/dL   Total Protein 7.5 6.5 - 8.1 g/dL   Albumin 4.2 3.5 - 5.0 g/dL   AST 11 (L) 15 - 41 U/L   ALT 14 0 - 44 U/L   Alkaline  Phosphatase 68 38 - 126 U/L   Total Bilirubin 0.6 0.3 - 1.2 mg/dL   GFR, Estimated >82 >95 mL/min   Anion gap 11 5 - 15  Lipase, blood  Result Value Ref Range   Lipase <10 (L) 11 - 51 U/L  Troponin I (High Sensitivity)  Result Value Ref Range   Troponin I (High Sensitivity) 4 <18 ng/L      Assessment & Plan:   Problem List Items Addressed This Visit       Digestive   Gastroesophageal reflux disease    Chronic, stable.  Continue Protonix 40 mg daily.        Other   Obesity, morbid (HCC)    BMI 47.5.  Information printed on healthy eating and exercise.      Generalized anxiety disorder    Chronic, stable.  Continue sertraline 50 mg daily.  She states that this is working really well for her.  Follow-up in 1 year or sooner with concerns.      Relevant Medications   sertraline (ZOLOFT) 50 MG tablet   Vitamin D deficiency    She has been taking a daily supplement.  Will check vitamin D levels and adjust regimen based on results      Relevant Orders   VITAMIN D 25 Hydroxy (Vit-D Deficiency, Fractures)   Anemia    Hemoglobin was normal a few weeks ago, will check ferritin today.  Continue taking iron supplement daily.      Relevant Orders   Ferritin   Elevated LDL cholesterol level    Last LDL was 105.  Discussed  nutrition and exercise. Check lipid panel today.       Relevant Orders   Lipid panel   Routine general medical examination at a health care facility - Primary    Health maintenance reviewed and updated. Discussed nutrition, exercise. Recent CMP, CBC reviewed. Follow-up 1 year.        Relevant Orders   Potassium   Prediabetes    Check A1c and treat based on results.       Relevant Orders   Hemoglobin A1c     Follow up plan: Return in about 1 year (around 02/19/2024) for CPE.   LABORATORY TESTING:  - Pap smear: up to date  IMMUNIZATIONS:   - Tdap: Tetanus vaccination status reviewed: last tetanus booster within 10 years. - Influenza: Postponed to flu season - Pneumovax: Not applicable - Prevnar: Not applicable - HPV: Not applicable - Shingrix vaccine: Not applicable  SCREENING: -Mammogram: Not applicable  - Colonoscopy: Not applicable  - Bone Density: Not applicable   PATIENT COUNSELING:   Advised to take 1 mg of folate supplement per day if capable of pregnancy.   Sexuality: Discussed sexually transmitted diseases, partner selection, use of condoms, avoidance of unintended pregnancy  and contraceptive alternatives.   Advised to avoid cigarette smoking.  I discussed with the patient that most people either abstain from alcohol or drink within safe limits (<=14/week and <=4 drinks/occasion for males, <=7/weeks and <= 3 drinks/occasion for females) and that the risk for alcohol disorders and other health effects rises proportionally with the number of drinks per week and how often a drinker exceeds daily limits.  Discussed cessation/primary prevention of drug use and availability of treatment for abuse.   Diet: Encouraged to adjust caloric intake to maintain  or achieve ideal body weight, to reduce intake of dietary saturated fat and total fat, to limit sodium intake by avoiding high sodium foods and  not adding table salt, and to maintain adequate dietary potassium and  calcium preferably from fresh fruits, vegetables, and low-fat dairy products.    stressed the importance of regular exercise  Injury prevention: Discussed safety belts, safety helmets, smoke detector, smoking near bedding or upholstery.   Dental health: Discussed importance of regular tooth brushing, flossing, and dental visits.    NEXT PREVENTATIVE PHYSICAL DUE IN 1 YEAR. Return in about 1 year (around 02/19/2024) for CPE.  Nasreen Goedecke A Georgios Kina

## 2023-02-19 NOTE — Assessment & Plan Note (Signed)
She has been taking a daily supplement.  Will check vitamin D levels and adjust regimen based on results

## 2023-02-19 NOTE — Assessment & Plan Note (Signed)
Last LDL was 105.  Discussed nutrition and exercise. Check lipid panel today.

## 2023-02-19 NOTE — Patient Instructions (Signed)
It was great to see you!  We are checking your labs today and will let you know the results via mychart/phone.   Let me know if you need any refills.   Let's follow-up in 1 year, sooner if you have concerns.  If a referral was placed today, you will be contacted for an appointment. Please note that routine referrals can sometimes take up to 3-4 weeks to process. Please call our office if you haven't heard anything after this time frame.  Take care,  Vance Peper, NP

## 2023-02-20 LAB — POTASSIUM: Potassium: 4.1 meq/L (ref 3.5–5.1)

## 2023-02-20 LAB — LIPID PANEL
Cholesterol: 166 mg/dL (ref 0–200)
HDL: 38.4 mg/dL — ABNORMAL LOW (ref >39.00)
LDL Cholesterol: 111 mg/dL — ABNORMAL HIGH (ref 0–99)
NonHDL: 127.56
Total CHOL/HDL Ratio: 4
Triglycerides: 85 mg/dL (ref 0.0–149.0)
VLDL: 17 mg/dL (ref 0.0–40.0)

## 2023-02-20 LAB — HEMOGLOBIN A1C: Hgb A1c MFr Bld: 5.7 % (ref 4.6–6.5)

## 2023-02-20 LAB — VITAMIN D 25 HYDROXY (VIT D DEFICIENCY, FRACTURES): VITD: 23.7 ng/mL — ABNORMAL LOW (ref 30.00–100.00)

## 2023-02-20 LAB — FERRITIN: Ferritin: 11.8 ng/mL (ref 10.0–291.0)

## 2023-07-21 ENCOUNTER — Telehealth: Payer: Self-pay | Admitting: Nurse Practitioner

## 2023-07-21 NOTE — Telephone Encounter (Signed)
Patient dropped off document  Health Assessment & TB screening form , to be filled out by provider. Patient requested to send it back via Call Patient to pick up within 7-days. Document is located in providers tray at front office.Please advise at Mobile [phone] (mobile)

## 2023-07-22 DIAGNOSIS — Z0279 Encounter for issue of other medical certificate: Secondary | ICD-10-CM

## 2023-07-22 NOTE — Telephone Encounter (Signed)
  CLINICAL USE BELOW THIS LINE (use X to signify action taken)  _x_ Form received and given to Salvatore Decent, NP for completion and for signature. ___ Form completed and faxed to LOA Dept.  ___ Form completed & LVM to notify patient ready for pick up.  ___ Charge sheet and copy of form in front office folder for office supervisor.

## 2023-07-22 NOTE — Telephone Encounter (Signed)
  CLINICAL USE BELOW THIS LINE (use X to signify action taken)  ___ Form received and placed in providers office for signature. ___ Form completed and faxed to LOA Dept.  _x__ Form completed & Notified patient ready for pick up.  ___ Charge sheet and copy of form in front office folder for office supervisor.

## 2023-07-22 NOTE — Telephone Encounter (Signed)
Form Completed

## 2023-08-26 ENCOUNTER — Encounter: Payer: Self-pay | Admitting: Nurse Practitioner

## 2023-08-27 ENCOUNTER — Telehealth (INDEPENDENT_AMBULATORY_CARE_PROVIDER_SITE_OTHER): Payer: Medicaid Other | Admitting: Nurse Practitioner

## 2023-08-27 ENCOUNTER — Encounter: Payer: Self-pay | Admitting: Nurse Practitioner

## 2023-08-27 VITALS — Ht 63.0 in | Wt 275.0 lb

## 2023-08-27 DIAGNOSIS — E7800 Pure hypercholesterolemia, unspecified: Secondary | ICD-10-CM | POA: Diagnosis not present

## 2023-08-27 DIAGNOSIS — Z6841 Body Mass Index (BMI) 40.0 and over, adult: Secondary | ICD-10-CM

## 2023-08-27 MED ORDER — WEGOVY 0.25 MG/0.5ML ~~LOC~~ SOAJ: 0.2500 mg | SUBCUTANEOUS | 0 refills | Status: DC

## 2023-08-27 NOTE — Patient Instructions (Addendum)
 It was great to see you!  Start wegovy injection once a week  Make sure you are drinking plenty of water  You can take MiraLAX daily as needed for constipation  Continue focusing on nutrition and slowly increase exercise  Let's follow-up on 10/07/23, sooner if you have concerns.  If a referral was placed today, you will be contacted for an appointment. Please note that routine referrals can sometimes take up to 3-4 weeks to process. Please call our office if you haven't heard anything after this time frame.  Take care,  Rodman Pickle, NP

## 2023-08-27 NOTE — Progress Notes (Signed)
 [facility] [address] [place] [address] Dept: [phone] Dept Fax: [phone]  Virtual Video Visit  I connected with Suzanne [name] on 08/27/23 at  1:20 PM EST by a video enabled telemedicine application and verified that I am speaking with the correct person using two identifiers.  Location patient: Home Location provider: Clinic Persons participating in the virtual visit: Patient; Rodman Pickle, NP; Malena Peer, CMA  I discussed the limitations of evaluation and management by telemedicine and the availability of in person appointments. The patient expressed understanding and agreed to proceed.  Chief Complaint  Patient presents with   Weight Management    Discuss weight loss medication options    SUBJECTIVE:  HPI: Suzanne [name] is a 33 y.o. female who presents to discuss weight loss medications.   She has been trying to lose weight for the past several years and has done a lot of the yoyo dieting.  She notes that she typically gains and loses the same 10 pounds.  She notes that her father passed away in March 28, 2024 and she gained 12 pounds from grief and being at home.  She has started a new job and now is not at home all day.  She has been trying to watch her portion sizes and increase her water.  She has lost 5 pounds in the last 2 months.  She is interested in weight loss medication, specifically any injectables to assist with weight loss.  She is hoping to lose weight slow and steady and keep it off from long-term.   Patient Active Problem List   Diagnosis Date Noted   Routine general medical examination at a health care facility 02/19/2023   Prediabetes 02/19/2023   Acute bilateral thoracic back pain 11/28/2021   Anemia 11/28/2021   Elevated LDL cholesterol level 11/28/2021   Gastroesophageal reflux disease 10/11/2019   Generalized anxiety disorder 10/11/2019   Vitamin D deficiency 10/11/2019    Obesity, morbid [facility])     Past Surgical History:  Procedure Laterality Date   CHOLECYSTECTOMY  10/2020   VAGINAL DELIVERY N/A 10/24/2016   Procedure: VAGINAL DELIVERY;  Surgeon: Lavina Hamman, MD;  Location: [facility] BIRTHING SUITES;  Service: Obstetrics;  Laterality: N/A;   WISDOM TOOTH EXTRACTION      Family History  Problem Relation Age of Onset   Varicose Veins Mother    Hypertension Father    Diabetes Father    Dementia Father    Healthy Child    Healthy Child    Healthy Child    Healthy Child     Social History   Tobacco Use   Smoking status: Never   Smokeless tobacco: Never  Vaping Use   Vaping status: Never Used  Substance Use Topics   Alcohol use: No   Drug use: No     Current Outpatient Medications:    pantoprazole (PROTONIX) 40 MG tablet, Take 1 tablet (40 mg total) by mouth daily., Disp: 90 tablet, Rfl: 1   Semaglutide-Weight Management (WEGOVY) 0.25 MG/0.5ML SOAJ, Inject 0.25 mg into the skin once a week., Disp: 2 mL, Rfl: 0   fluticasone (FLONASE) 50 MCG/ACT nasal spray, Place 2 sprays into both nostrils daily. (Patient not taking: Reported on 08/27/2023), Disp: 16 g, Rfl: 6   loratadine (CLARITIN) 10 MG tablet, Take 1 tablet (10 mg total) by mouth daily. (Patient not taking: Reported on 08/27/2023), Disp: 90 tablet, Rfl: 1   sertraline (ZOLOFT) 50 MG tablet, Take 1 tablet (  50 mg total) by mouth daily. (Patient not taking: Reported on 08/27/2023), Disp: 90 tablet, Rfl: 3  Allergies  Allergen Reactions   Tuberculin, Ppd Itching and Rash    ROS: See pertinent positives and negatives per HPI.  OBSERVATIONS/OBJECTIVE:  VITALS per patient if applicable: Today's Vitals   08/27/23 1306  Weight: 275 lb (124.7 kg)  Height: 5\' 3"  (1.6 m)   Body mass index is 48.71 kg/m.    GENERAL: Alert and oriented. Appears well and in no acute distress.  HEENT: Atraumatic. Conjunctiva clear. No obvious abnormalities on inspection of external nose and ears.  NECK:  Normal movements of the head and neck.  LUNGS: On inspection, no signs of respiratory distress. Breathing rate appears normal. No obvious gross SOB, gasping or wheezing, and no conversational dyspnea.  CV: No obvious cyanosis.  MS: Moves all visible extremities without noticeable abnormality.  PSYCH/NEURO: Pleasant and cooperative. No obvious depression or anxiety. Speech and thought processing grossly intact.  ASSESSMENT AND PLAN:  Problem List Items Addressed This Visit       Other   Obesity, morbid (HCC)   Chronic, ongoing. BMI 48.7.  She has lost 5 pounds in the last 2 months.  She has been focusing on nutrition and increasing her water.  She is interested in weight loss medication to assist with weight loss.  Will have her start Wegovy 0.25 mg injection weekly.  Discussed possible side effects.  She does not have a family history of thyroid cancer.  Will have her continue focus on nutrition and slowly increasing exercise 250 minutes/week.  Follow-up in 4 to 6 weeks.      Relevant Medications   Semaglutide-Weight Management (WEGOVY) 0.25 MG/0.5ML SOAJ   Elevated LDL cholesterol level - Primary   Chronic, stable.  Recommend weight loss with nutrition and exercise.        I discussed the assessment and treatment plan with the patient. The patient was provided an opportunity to ask questions and all were answered. The patient agreed with the plan and demonstrated an understanding of the instructions.   The patient was advised to call back or seek an in-person evaluation if the symptoms worsen or if the condition fails to improve as anticipated.   Gerre Scull, NP

## 2023-08-27 NOTE — Assessment & Plan Note (Signed)
 Chronic, stable.  Recommend weight loss with nutrition and exercise.

## 2023-08-27 NOTE — Assessment & Plan Note (Addendum)
 Chronic, ongoing. BMI 48.7.  She has lost 5 pounds in the last 2 months.  She has been focusing on nutrition and increasing her water.  She is interested in weight loss medication to assist with weight loss.  Will have her start Wegovy 0.25 mg injection weekly.  Discussed possible side effects.  She does not have a family history of thyroid cancer.  Will have her continue focus on nutrition and slowly increasing exercise 250 minutes/week.  Follow-up in 4 to 6 weeks.

## 2023-09-04 ENCOUNTER — Telehealth: Payer: Self-pay

## 2023-09-04 ENCOUNTER — Other Ambulatory Visit (HOSPITAL_COMMUNITY): Payer: Self-pay

## 2023-09-04 NOTE — Telephone Encounter (Signed)
 Pharmacy Patient Advocate Encounter   Received notification from Pt Calls Messages that prior authorization for Wegovy 0.25mg /0.72ml is required/requested.   Insurance verification completed.   The patient is insured through [employer] [facility] [employer] .   Per test claim: PA required; PA submitted to above mentioned insurance via CoverMyMeds Key/confirmation #/EOC [id number] Status is pending

## 2023-09-04 NOTE — Telephone Encounter (Signed)
Can we get a prior auth on Wegovy 0.25mg /0.36ml?

## 2023-09-07 NOTE — Telephone Encounter (Signed)
 Pharmacy Patient Advocate Encounter  Received notification from [facility] that Prior Authorization for Wegovy 0.25mg /0.75ml has been APPROVED from 09/04/23 to 04/05/24   PA #/Case ID/Reference #: [id number]

## 2023-10-07 ENCOUNTER — Ambulatory Visit: Payer: Medicaid Other | Admitting: Nurse Practitioner

## 2023-10-07 ENCOUNTER — Encounter: Payer: Self-pay | Admitting: Nurse Practitioner

## 2023-10-07 VITALS — BP 124/82 | HR 70 | Temp 97.6°F | Ht 63.0 in | Wt 272.8 lb

## 2023-10-07 DIAGNOSIS — J302 Other seasonal allergic rhinitis: Secondary | ICD-10-CM

## 2023-10-07 MED ORDER — WEGOVY 0.5 MG/0.5ML ~~LOC~~ SOAJ: 0.5000 mg | SUBCUTANEOUS | 0 refills | Status: DC

## 2023-10-07 NOTE — Assessment & Plan Note (Signed)
 Chronic, stable. Continue claritin and flonase daily as needed.

## 2023-10-07 NOTE — Assessment & Plan Note (Signed)
 She has lost three pounds on Wegovy without side effects. Increase the dose to 0.5 mg weekly, with potential future increases based on response and tolerance. Monitor for side effects and weight loss. Plan a follow-up in 2-3 months to assess progress and adjust dosage if necessary. Continue focus on nutrition and exercise.

## 2023-10-07 NOTE — Progress Notes (Signed)
 Established Patient Office Visit  Subjective   Patient ID: Suzanne [name], female    DOB: Nov 10, 1990  Age: 33 y.o. MRN: [id number]  Chief Complaint  Patient presents with   Weight Management    Follow Up, no concerns    HPI  Discussed the use of AI scribe software for clinical note transcription with the patient, who gave verbal consent to proceed.  History of Present Illness   The patient, with a history of obesity, presents for a follow-up visit after starting [facility]. She reports no side effects from the medication and has lost three pounds since the last visit. She also notes a decrease in cravings for sweet foods. She has not yet incorporated exercise into her routine but plans to do so as the weather warms.  In addition to obesity, the patient has a history of allergies, for which she takes Claritin as needed. She has not needed to take it recently despite the presence of pollen.        ROS See pertinent positives and negatives per HPI.    Objective:     BP 124/82 (BP Location: Left Arm, Patient Position: Sitting, Cuff Size: Large)   Pulse 70   Temp 97.6 F (36.4 C)   Ht 5\' 3"  (1.6 m)   Wt 272 lb 12.8 oz (123.7 kg)   LMP 09/10/2023 (Approximate)   SpO2 100%   BMI 48.32 kg/m  BP Readings from Last 3 Encounters:  10/07/23 124/82  02/19/23 118/84  01/30/23 110/79   Wt Readings from Last 3 Encounters:  10/07/23 272 lb 12.8 oz (123.7 kg)  08/27/23 275 lb (124.7 kg)  02/19/23 268 lb 9.6 oz (121.8 kg)      Physical Exam Vitals and nursing note reviewed.  Constitutional:      General: She is not in acute distress.    Appearance: Normal appearance.  HENT:     Head: Normocephalic.  Eyes:     Conjunctiva/sclera: Conjunctivae normal.  Cardiovascular:     Rate and Rhythm: Normal rate and regular rhythm.     Pulses: Normal pulses.     Heart sounds: Normal heart sounds.  Pulmonary:     Effort: Pulmonary effort is normal.     Breath sounds: Normal breath  sounds.  Musculoskeletal:     Cervical back: Normal range of motion.  Skin:    General: Skin is warm.  Neurological:     General: No focal deficit present.     Mental Status: She is alert and oriented to person, place, and time.  Psychiatric:        Mood and Affect: Mood normal.        Behavior: Behavior normal.        Thought Content: Thought content normal.        Judgment: Judgment normal.      Assessment & Plan:   Problem List Items Addressed This Visit       Other   Obesity, morbid (HCC)   She has lost three pounds on Wegovy without side effects. Increase the dose to 0.5 mg weekly, with potential future increases based on response and tolerance. Monitor for side effects and weight loss. Plan a follow-up in 2-3 months to assess progress and adjust dosage if necessary. Continue focus on nutrition and exercise.        Relevant Medications   Semaglutide-Weight Management (WEGOVY) 0.5 MG/0.5ML SOAJ   Seasonal allergies - Primary   Chronic, stable. Continue claritin and flonase daily as  needed.        Return in about 3 months (around 01/06/2024) for 2-3 months weight management .    Gerre Scull, NP

## 2023-10-07 NOTE — Patient Instructions (Signed)
 It was great to see you!  Keep up the great work!  Let's increase your Wegovy to 0.5 weekly  Let's follow-up in 2-3 months, sooner if you have concerns.  If a referral was placed today, you will be contacted for an appointment. Please note that routine referrals can sometimes take up to 3-4 weeks to process. Please call our office if you haven't heard anything after this time frame.  Take care,  Rodman Pickle, NP

## 2023-10-29 ENCOUNTER — Other Ambulatory Visit: Payer: Self-pay | Admitting: Nurse Practitioner

## 2023-10-29 NOTE — Telephone Encounter (Signed)
 Copied from CRM [id number]. Topic: Clinical - Medication Refill >> Oct 29, 2023  1:30 PM [place] E wrote: Most Recent Primary Care Visit:  Provider: MCELWEE, LAUREN A  Department: [facility]  Visit Type: OFFICE VISIT  Date: 10/07/2023  Medication: Semaglutide -Weight Management (WEGOVY ) 0.5 MG/0.5ML SOAJ  Has the patient contacted their pharmacy? No (Agent: If no, request that the patient contact the pharmacy for the refill. If patient does not wish to contact the pharmacy document the reason why and proceed with request.) (Agent: If yes, when and what did the pharmacy advise?)  Is this the correct pharmacy for this prescription? Yes If no, delete pharmacy and type the correct one.  This is the patient's preferred pharmacy:  [facility] [address], [place] - [address] [place] [address] Phone: [phone] Fax: [phone]   Has the prescription been filled recently? Yes  Is the patient out of the medication? No, patient has one day left.  Has the patient been seen for an appointment in the last year OR does the patient have an upcoming appointment? Yes  Can we respond through MyChart? Yes  Agent: Please be advised that Rx refills may take up to 3 business days. We ask that you follow-up with your pharmacy.

## 2023-10-30 ENCOUNTER — Other Ambulatory Visit: Payer: Self-pay | Admitting: Nurse Practitioner

## 2023-10-30 MED ORDER — WEGOVY 1 MG/0.5ML ~~LOC~~ SOAJ: 1.0000 mg | SUBCUTANEOUS | 0 refills | Status: DC

## 2023-10-30 NOTE — Telephone Encounter (Signed)
 Requesting: WEGOVY  0.5 MG/0.5 ML PEN  Last Visit: 10/07/2023 Next Visit: 01/06/2024 Last Refill: 10/07/2023  Please Advise

## 2023-10-30 NOTE — Telephone Encounter (Signed)
 duplicate

## 2023-11-26 ENCOUNTER — Other Ambulatory Visit: Payer: Self-pay | Admitting: Nurse Practitioner

## 2023-11-26 NOTE — Telephone Encounter (Signed)
 Requesting: WEGOVY  1 MG/0.5 ML PEN  Last Visit: 10/07/2023 Next Visit: 01/08/2024 Last Refill: 10/30/2023  Please Advise

## 2023-12-01 ENCOUNTER — Encounter: Payer: Self-pay | Admitting: Nurse Practitioner

## 2023-12-01 NOTE — Telephone Encounter (Signed)
 Requesting: Wegovy  Last Visit: 10/07/2023 Next Visit: 01/08/2024 Last Refill: 11/27/2023  Please Advise

## 2023-12-02 ENCOUNTER — Other Ambulatory Visit: Payer: Self-pay | Admitting: Nurse Practitioner

## 2023-12-02 MED ORDER — WEGOVY 1.7 MG/0.75ML ~~LOC~~ SOAJ: 1.7000 mg | SUBCUTANEOUS | 1 refills | Status: DC

## 2023-12-31 ENCOUNTER — Encounter: Payer: Self-pay | Admitting: Nurse Practitioner

## 2023-12-31 ENCOUNTER — Ambulatory Visit: Admitting: Nurse Practitioner

## 2023-12-31 VITALS — BP 122/80 | HR 91 | Temp 97.5°F | Ht 63.0 in | Wt 255.8 lb

## 2023-12-31 DIAGNOSIS — K5903 Drug induced constipation: Secondary | ICD-10-CM

## 2023-12-31 DIAGNOSIS — K219 Gastro-esophageal reflux disease without esophagitis: Secondary | ICD-10-CM

## 2023-12-31 DIAGNOSIS — Z6841 Body Mass Index (BMI) 40.0 and over, adult: Secondary | ICD-10-CM

## 2023-12-31 MED ORDER — WEGOVY 2.4 MG/0.75ML ~~LOC~~ SOAJ: 2.4000 mg | SUBCUTANEOUS | 2 refills | Status: DC

## 2023-12-31 NOTE — Patient Instructions (Signed)
It was great to see you!  Keep up the great work!   Let's follow-up in 3 months, sooner if you have concerns.  If a referral was placed today, you will be contacted for an appointment. Please note that routine referrals can sometimes take up to 3-4 weeks to process. Please call our office if you haven't heard anything after this time frame.  Take care,  Gordana Kewley, NP  

## 2023-12-31 NOTE — Assessment & Plan Note (Signed)
 BMI 45.3. She lost 24 pounds on Wegovy  and now weighs 255 pounds. Increased appetite suggests a need for dose adjustment. Increase Wegovy  to 2.4mg  weekly. Continue lifestyle modifications, including walking and healthy eating. Follow up in three months for weight management and medication evaluation. Follow-up in 3 months.

## 2023-12-31 NOTE — Assessment & Plan Note (Signed)
 Constipation is managed with senna and peppermint tea, a side effect of Wegovy . Continue using senna and peppermint tea for management.

## 2023-12-31 NOTE — Assessment & Plan Note (Signed)
 GERD symptoms are controlled with daily pantoprazole . Continue pantoprazole  40mg  daily for management.

## 2023-12-31 NOTE — Progress Notes (Signed)
 Established Patient Office Visit  Subjective   Patient ID: Suzanne [name], female    DOB: Jul 07, 1990  Age: 33 y.o. MRN: [id number]  Chief Complaint  Patient presents with   Follow-up    3 month f/u weight management.     HPI Discussed the use of AI scribe software for clinical note transcription with the patient, who gave verbal consent to proceed.  History of Present Illness   Suzanne [name] is a 33 year old female who presents for weight management follow-up.  She has lost 17 pounds since her last visit, attributing this to her current regimen of Wegovy  and increased physical activity. She walks at least one mile three times a week and has increased her water intake. Her appetite has decreased, sometimes requiring reminders to eat. She has been eating plenty of fruits and eating smaller portions.   She experiences constipation as a side effect of Wegovy  and manages it with Smooth Move tea containing senna and peppermint, which effectively alleviates her symptoms. She is on a 1.7 mg dose of Wegovy  but has noticed an increase in appetite over the past week and would like to go up on the dose.   She experiences burping with certain vegetables like spinach, which she has reduced in her diet. She continues to eat fruits and some vegetables, focusing on eating less and healthier overall. Her reflux is well-controlled with pantoprazole .       ROS See pertinent positives and negatives per HPI.    Objective:     BP 122/80   Pulse 91   Temp (!) 97.5 F (36.4 C) (Temporal)   Ht 5' 3 (1.6 m)   Wt 255 lb 12.8 oz (116 kg)   LMP 12/21/2023   SpO2 97%   BMI 45.31 kg/m  BP Readings from Last 3 Encounters:  12/31/23 122/80  10/07/23 124/82  02/19/23 118/84   Wt Readings from Last 3 Encounters:  12/31/23 255 lb 12.8 oz (116 kg)  10/07/23 272 lb 12.8 oz (123.7 kg)  08/27/23 275 lb (124.7 kg)      Physical Exam Vitals and nursing note reviewed.  Constitutional:      General: She  is not in acute distress.    Appearance: Normal appearance.  HENT:     Head: Normocephalic.  Eyes:     Conjunctiva/sclera: Conjunctivae normal.  Cardiovascular:     Rate and Rhythm: Normal rate and regular rhythm.     Pulses: Normal pulses.     Heart sounds: Normal heart sounds.  Pulmonary:     Effort: Pulmonary effort is normal.     Breath sounds: Normal breath sounds.  Musculoskeletal:     Cervical back: Normal range of motion.  Skin:    General: Skin is warm.  Neurological:     General: No focal deficit present.     Mental Status: She is alert and oriented to person, place, and time.  Psychiatric:        Mood and Affect: Mood normal.        Behavior: Behavior normal.        Thought Content: Thought content normal.        Judgment: Judgment normal.      Assessment & Plan:   Problem List Items Addressed This Visit       Digestive   Gastroesophageal reflux disease - Primary   GERD symptoms are controlled with daily pantoprazole . Continue pantoprazole  40mg  daily for management.  Drug-induced constipation   Constipation is managed with senna and peppermint tea, a side effect of Wegovy . Continue using senna and peppermint tea for management.        Other   Obesity, morbid (HCC)   BMI 45.3. She lost 24 pounds on Wegovy  and now weighs 255 pounds. Increased appetite suggests a need for dose adjustment. Increase Wegovy  to 2.4mg  weekly. Continue lifestyle modifications, including walking and healthy eating. Follow up in three months for weight management and medication evaluation. Follow-up in 3 months.       Relevant Medications   Semaglutide -Weight Management (WEGOVY ) 2.4 MG/0.75ML SOAJ    Return in about 3 months (around 04/01/2024) for CPE.    Tinnie DELENA Harada, NP

## 2024-01-06 ENCOUNTER — Ambulatory Visit: Admitting: Nurse Practitioner

## 2024-01-08 ENCOUNTER — Ambulatory Visit: Admitting: Nurse Practitioner

## 2024-01-27 ENCOUNTER — Other Ambulatory Visit: Payer: Self-pay | Admitting: Nurse Practitioner

## 2024-01-27 NOTE — Telephone Encounter (Signed)
 Requesting: PANTOPRAZOLE  SOD DR 40 MG TAB  Last Visit: 12/31/2023 Next Visit: 04/01/2024 Last Refill: 01/22/2023  Please Advise

## 2024-03-29 ENCOUNTER — Other Ambulatory Visit: Payer: Self-pay | Admitting: Nurse Practitioner

## 2024-03-29 NOTE — Telephone Encounter (Signed)
 Requesting: WEGOVY  2.4 MG/0.75 ML PEN  Last Visit: 12/31/2023 Next Visit: 04/01/2024 Last Refill: 12/31/2023  Please Advise

## 2024-03-30 MED ORDER — WEGOVY 2.4 MG/0.75ML ~~LOC~~ SOAJ: 2.4000 mg | SUBCUTANEOUS | 2 refills | Status: DC

## 2024-03-30 NOTE — Telephone Encounter (Signed)
 Patient returned call and said that she has [employer] now and does not have Medicaid and will bring insurance card on Friday.

## 2024-03-30 NOTE — Addendum Note (Signed)
 Addended by: Elianis Fischbach A on: 03/30/2024 03:12 PM   Modules accepted: Orders

## 2024-03-30 NOTE — Telephone Encounter (Signed)
Let message for patient to return call.

## 2024-04-01 ENCOUNTER — Encounter: Admitting: Nurse Practitioner

## 2024-04-01 ENCOUNTER — Ambulatory Visit: Admitting: Nurse Practitioner

## 2024-04-01 VITALS — BP 114/80 | HR 81 | Temp 97.2°F | Ht 63.0 in | Wt 251.0 lb

## 2024-04-01 DIAGNOSIS — F411 Generalized anxiety disorder: Secondary | ICD-10-CM

## 2024-04-01 DIAGNOSIS — E7800 Pure hypercholesterolemia, unspecified: Secondary | ICD-10-CM

## 2024-04-01 DIAGNOSIS — Z0000 Encounter for general adult medical examination without abnormal findings: Secondary | ICD-10-CM

## 2024-04-01 DIAGNOSIS — R7303 Prediabetes: Secondary | ICD-10-CM

## 2024-04-01 DIAGNOSIS — E559 Vitamin D deficiency, unspecified: Secondary | ICD-10-CM

## 2024-04-01 NOTE — Assessment & Plan Note (Signed)
 Chronic, stable.  Recommend weight loss with nutrition and exercise. Check CMP, CBC, lipid panel today.

## 2024-04-01 NOTE — Assessment & Plan Note (Signed)
She has been taking a daily supplement.  Will check vitamin D levels and adjust regimen based on results

## 2024-04-01 NOTE — Assessment & Plan Note (Signed)
Check A1c and treat based on results. 

## 2024-04-01 NOTE — Patient Instructions (Signed)
 It was great to see you!  We are checking your labs today and will let you know the results via mychart/phone.   I have attached some stretches to help with your knee pain   Let's follow-up in 3 months, sooner if you have concerns.  If a referral was placed today, you will be contacted for an appointment. Please note that routine referrals can sometimes take up to 3-4 weeks to process. Please call our office if you haven't heard anything after this time frame.  Take care,  Tinnie Harada, NP

## 2024-04-01 NOTE — Assessment & Plan Note (Signed)
 Health maintenance reviewed and updated. Discussed nutrition, exercise. Follow-up 1 year.

## 2024-04-01 NOTE — Assessment & Plan Note (Signed)
 BMI 44.4. She is continuing to lose weight on Wegovy . Continue lifestyle modifications, including walking and healthy eating. Follow up in three months for weight management and medication evaluation. Follow-up in 3 months.

## 2024-04-01 NOTE — Assessment & Plan Note (Signed)
Chronic, stable.  Continue sertraline 50 mg daily.  She states that this is working really well for her.  Follow-up in 1 year or sooner with concerns.

## 2024-04-01 NOTE — Progress Notes (Signed)
 BP 114/80 (BP Location: Left Arm, Patient Position: Sitting, Cuff Size: Large)   Pulse 81   Temp (!) 97.2 F (36.2 C)   Ht 5' 3 (1.6 m)   Wt 251 lb (113.9 kg)   LMP 03/09/2024 (Approximate)   SpO2 98%   BMI 44.46 kg/m    Subjective:    Patient ID: Suzanne [name], female    DOB: 1990/08/31, 33 y.o.   MRN: [id number]  CC: Chief Complaint  Patient presents with   Annual Exam    With non fasting labs, no concerns    HPI: Suzanne [name] is a 33 y.o. female presenting on 04/01/2024 for comprehensive medical examination. Current medical complaints include:none  She currently lives with: husband, children Menopausal Symptoms: no  Depression and Anxiety Screen done today and results listed below:     04/01/2024    3:58 PM 10/07/2023    8:24 AM 08/27/2023    1:07 PM 02/19/2023    3:20 PM 01/22/2023    8:15 AM  Depression screen PHQ 2/9  Decreased Interest 0 0 0 0 0  Down, Depressed, Hopeless 0 0 0 0 0  PHQ - 2 Score 0 0 0 0 0  Altered sleeping 0   0 1  Tired, decreased energy 0   0 1  Change in appetite 0   0 1  Feeling bad or failure about yourself  0   0 0  Trouble concentrating 0   0 1  Moving slowly or fidgety/restless 0   0 0  Suicidal thoughts 0   0 0  PHQ-9 Score 0   0 4  Difficult doing work/chores Not difficult at all    Somewhat difficult      04/01/2024    3:58 PM 02/19/2023    3:20 PM 01/22/2023    8:15 AM 10/03/2020    9:11 AM  GAD 7 : Generalized Anxiety Score  Nervous, Anxious, on Edge 0 1 1 0  Control/stop worrying 0 0 1 0  Worry too much - different things 0 1 1 0  Trouble relaxing 0 0 1 0  Restless 0 0 0 0  Easily annoyed or irritable 0 0 1 0  Afraid - awful might happen 0 0 1 0  Total GAD 7 Score 0 2 6 0  Anxiety Difficulty Not difficult at all Not difficult at all Somewhat difficult     The patient does not have a history of falls. I did not complete a risk assessment for falls. A plan of care for falls was not documented.   Past Medical History:   Past Medical History:  Diagnosis Date   Anxiety    COVID-19    GERD (gastroesophageal reflux disease)    Obesity    Symptomatic cholelithiasis     Surgical History:  Past Surgical History:  Procedure Laterality Date   CHOLECYSTECTOMY  10/2020   VAGINAL DELIVERY N/A 10/24/2016   Procedure: VAGINAL DELIVERY;  Surgeon: Krystal Deaner, MD;  Location: [facility];  Service: Obstetrics;  Laterality: N/A;   WISDOM TOOTH EXTRACTION      Medications:  Current Outpatient Medications on File Prior to Visit  Medication Sig   fluticasone  (FLONASE ) 50 MCG/ACT nasal spray Place 2 sprays into both nostrils daily. (Patient taking differently: Place 2 sprays into both nostrils as needed.)   loratadine  (CLARITIN ) 10 MG tablet Take 1 tablet (10 mg total) by mouth daily. (Patient taking differently: Take 10 mg by mouth as needed.)  pantoprazole  (PROTONIX ) 40 MG tablet TAKE 1 TABLET BY MOUTH ONCE DAILY   semaglutide -weight management (WEGOVY ) 2.4 MG/0.75ML SOAJ SQ injection Inject 2.4 mg into the skin once a week.   sertraline  (ZOLOFT ) 50 MG tablet Take 1 tablet (50 mg total) by mouth daily.   No current facility-administered medications on file prior to visit.    Allergies:  Allergies  Allergen Reactions   Tuberculin, Ppd Itching and Rash    Social History:  Social History   Socioeconomic History   Marital status: Married    Spouse name: Camellia   Number of children: 4   Years of education: Not on file   Highest education level: Some college, no degree  Occupational History   Not on file  Tobacco Use   Smoking status: Never   Smokeless tobacco: Never  Vaping Use   Vaping status: Never Used  Substance and Sexual Activity   Alcohol use: No   Drug use: No   Sexual activity: Yes    Partners: Male    Birth control/protection: Other-see comments    Comment: Husband has vasectomy  Other Topics Concern   Not on file  Social History Narrative   Not on file   Social Drivers of  Health   Financial Resource Strain: Low Risk  (04/01/2024)   Overall Financial Resource Strain (CARDIA)    Difficulty of Paying Living Expenses: Not very hard  Food Insecurity: No Food Insecurity (04/01/2024)   Hunger Vital Sign    Worried About Running Out of Food in the Last Year: Never true    Ran Out of Food in the Last Year: Never true  Transportation Needs: No Transportation Needs (04/01/2024)   PRAPARE - Administrator, Civil Service (Medical): No    Lack of Transportation (Non-Medical): No  Physical Activity: Insufficiently Active (04/01/2024)   Exercise Vital Sign    Days of Exercise per Week: 3 days    Minutes of Exercise per Session: 30 min  Stress: No Stress Concern Present (04/01/2024)   [employer] of Occupational Health - Occupational Stress Questionnaire    Feeling of Stress: Only a little  Social Connections: Socially Integrated (04/01/2024)   Social Connection and Isolation Panel    Frequency of Communication with Friends and Family: More than three times a week    Frequency of Social Gatherings with Friends and Family: Once a week    Attends Religious Services: More than 4 times per year    Active Member of [employer] or Organizations: Yes    Attends Engineer, structural: 1 to 4 times per year    Marital Status: Married  Catering manager Violence: Not on file   Social History   Tobacco Use  Smoking Status Never  Smokeless Tobacco Never   Social History   Substance and Sexual Activity  Alcohol Use No    Family History:  Family History  Problem Relation Age of Onset   Varicose Veins Mother    Hypertension Father    Diabetes Father    Dementia Father    Healthy Child    Healthy Child    Healthy Child    Healthy Child     Past medical history, surgical history, medications, allergies, family history and social history reviewed with patient today and changes made to appropriate areas of the chart.   Review of Systems   Constitutional: Negative.   HENT: Negative.    Eyes: Negative.   Respiratory: Negative.    Cardiovascular: Negative.  Gastrointestinal: Negative.   Genitourinary: Negative.   Musculoskeletal:  Positive for joint pain (left knee at times).  Skin: Negative.   Neurological: Negative.   Psychiatric/Behavioral: Negative.     All other ROS negative except what is listed above and in the HPI.      Objective:    BP 114/80 (BP Location: Left Arm, Patient Position: Sitting, Cuff Size: Large)   Pulse 81   Temp (!) 97.2 F (36.2 C)   Ht 5' 3 (1.6 m)   Wt 251 lb (113.9 kg)   LMP 03/09/2024 (Approximate)   SpO2 98%   BMI 44.46 kg/m   Wt Readings from Last 3 Encounters:  04/01/24 251 lb (113.9 kg)  12/31/23 255 lb 12.8 oz (116 kg)  10/07/23 272 lb 12.8 oz (123.7 kg)    Physical Exam Vitals and nursing note reviewed.  Constitutional:      General: She is not in acute distress.    Appearance: Normal appearance.  HENT:     Head: Normocephalic and atraumatic.     Right Ear: Tympanic membrane, ear canal and external ear normal.     Left Ear: Tympanic membrane, ear canal and external ear normal.     Mouth/Throat:     Mouth: Mucous membranes are moist.     Pharynx: No posterior oropharyngeal erythema.  Eyes:     Conjunctiva/sclera: Conjunctivae normal.  Cardiovascular:     Rate and Rhythm: Normal rate and regular rhythm.     Pulses: Normal pulses.     Heart sounds: Normal heart sounds.  Pulmonary:     Effort: Pulmonary effort is normal.     Breath sounds: Normal breath sounds.  Abdominal:     Palpations: Abdomen is soft.     Tenderness: There is no abdominal tenderness.  Musculoskeletal:        General: Normal range of motion.     Cervical back: Normal range of motion and neck supple.     Right lower leg: No edema.     Left lower leg: No edema.  Lymphadenopathy:     Cervical: No cervical adenopathy.  Skin:    General: Skin is warm and dry.  Neurological:     General:  No focal deficit present.     Mental Status: She is alert and oriented to person, place, and time.     Cranial Nerves: No cranial nerve deficit.     Coordination: Coordination normal.     Gait: Gait normal.  Psychiatric:        Mood and Affect: Mood normal.        Behavior: Behavior normal.        Thought Content: Thought content normal.        Judgment: Judgment normal.     Results for orders placed or performed in visit on 02/19/23  Potassium   Collection Time: 02/19/23  3:45 PM  Result Value Ref Range   Potassium 4.1 3.5 - 5.1 mEq/L  Lipid panel   Collection Time: 02/19/23  3:45 PM  Result Value Ref Range   Cholesterol 166 0 - 200 mg/dL   Triglycerides 14.9 0.0 - 149.0 mg/dL   HDL 61.59 (L) >60.99 mg/dL   VLDL 82.9 0.0 - 59.9 mg/dL   LDL Cholesterol 888 (H) 0 - 99 mg/dL   Total CHOL/HDL Ratio 4    NonHDL 127.56   VITAMIN D  25 Hydroxy (Vit-D Deficiency, Fractures)   Collection Time: 02/19/23  3:45 PM  Result Value Ref Range  VITD 23.70 (L) 30.00 - 100.00 ng/mL  Ferritin   Collection Time: 02/19/23  3:45 PM  Result Value Ref Range   Ferritin 11.8 10.0 - 291.0 ng/mL  Hemoglobin A1c   Collection Time: 02/19/23  3:45 PM  Result Value Ref Range   Hgb A1c MFr Bld 5.7 4.6 - 6.5 %      Assessment & Plan:   Problem List Items Addressed This Visit       Other   Obesity, morbid (HCC)   BMI 44.4. She is continuing to lose weight on Wegovy . Continue lifestyle modifications, including walking and healthy eating. Follow up in three months for weight management and medication evaluation. Follow-up in 3 months.       Generalized anxiety disorder   Chronic, stable.  Continue sertraline  50 mg daily.  She states that this is working really well for her.  Follow-up in 1 year or sooner with concerns.      Vitamin D  deficiency   She has been taking a daily supplement.  Will check vitamin D  levels and adjust regimen based on results      Relevant Orders   VITAMIN D  25 Hydroxy  (Vit-D Deficiency, Fractures)   Elevated LDL cholesterol level   Chronic, stable.  Recommend weight loss with nutrition and exercise. Check CMP, CBC, lipid panel today.       Relevant Orders   CBC with Differential/Platelet   Comprehensive metabolic panel with GFR   Lipid panel   Routine general medical examination at a health care facility - Primary   Health maintenance reviewed and updated. Discussed nutrition, exercise. Follow-up 1 year.        Prediabetes   Check A1c and treat based on results.       Relevant Orders   Hemoglobin A1c     Follow up plan: Return in about 3 months (around 07/02/2024) for weight management .   LABORATORY TESTING:  - Pap smear: up to date  IMMUNIZATIONS:   - Tdap: Tetanus vaccination status reviewed: last tetanus booster within 10 years. - Influenza: Declined - Pneumovax: Not applicable - Prevnar: Not applicable - HPV: Declined - Shingrix vaccine: Not applicable  SCREENING: -Mammogram: Not applicable  - Colonoscopy: Not applicable  - Bone Density: Not applicable   PATIENT COUNSELING:   Advised to take 1 mg of folate supplement per day if capable of pregnancy.   Sexuality: Discussed sexually transmitted diseases, partner selection, use of condoms, avoidance of unintended pregnancy  and contraceptive alternatives.   Advised to avoid cigarette smoking.  I discussed with the patient that most people either abstain from alcohol or drink within safe limits (<=14/week and <=4 drinks/occasion for males, <=7/weeks and <= 3 drinks/occasion for females) and that the risk for alcohol disorders and other health effects rises proportionally with the number of drinks per week and how often a drinker exceeds daily limits.  Discussed cessation/primary prevention of drug use and availability of treatment for abuse.   Diet: Encouraged to adjust caloric intake to maintain  or achieve ideal body weight, to reduce intake of dietary saturated fat and total  fat, to limit sodium intake by avoiding high sodium foods and not adding table salt, and to maintain adequate dietary potassium and calcium preferably from fresh fruits, vegetables, and low-fat dairy products.    stressed the importance of regular exercise  Injury prevention: Discussed safety belts, safety helmets, smoke detector, smoking near bedding or upholstery.   Dental health: Discussed importance of regular tooth brushing, flossing,  and dental visits.    NEXT PREVENTATIVE PHYSICAL DUE IN 1 YEAR. Return in about 3 months (around 07/02/2024) for weight management .  Suzanne [name]

## 2024-04-02 LAB — CBC WITH DIFFERENTIAL/PLATELET
Absolute Lymphocytes: 1970 {cells}/uL (ref 850–3900)
Absolute Monocytes: 567 {cells}/uL (ref 200–950)
Basophils Absolute: 43 {cells}/uL (ref 0–200)
Basophils Relative: 0.7 %
Eosinophils Absolute: 372 {cells}/uL (ref 15–500)
Eosinophils Relative: 6.1 %
HCT: 37.8 % (ref 35.0–45.0)
Hemoglobin: 12.6 g/dL (ref 11.7–15.5)
MCH: 26.5 pg — ABNORMAL LOW (ref 27.0–33.0)
MCHC: 33.3 g/dL (ref 32.0–36.0)
MCV: 79.4 fL — ABNORMAL LOW (ref 80.0–100.0)
MPV: 9 fL (ref 7.5–12.5)
Monocytes Relative: 9.3 %
Neutro Abs: 3148 {cells}/uL (ref 1500–7800)
Neutrophils Relative %: 51.6 %
Platelets: 359 Thousand/uL (ref 140–400)
RBC: 4.76 Million/uL (ref 3.80–5.10)
RDW: 15.3 % — ABNORMAL HIGH (ref 11.0–15.0)
Total Lymphocyte: 32.3 %
WBC: 6.1 Thousand/uL (ref 3.8–10.8)

## 2024-04-02 LAB — COMPREHENSIVE METABOLIC PANEL WITH GFR
AG Ratio: 1.5 (calc) (ref 1.0–2.5)
ALT: 12 U/L (ref 6–29)
AST: 11 U/L (ref 10–30)
Albumin: 4.2 g/dL (ref 3.6–5.1)
Alkaline phosphatase (APISO): 69 U/L (ref 31–125)
BUN: 12 mg/dL (ref 7–25)
CO2: 28 mmol/L (ref 20–32)
Calcium: 9.3 mg/dL (ref 8.6–10.2)
Chloride: 102 mmol/L (ref 98–110)
Creat: 0.85 mg/dL (ref 0.50–0.97)
Globulin: 2.8 g/dL (ref 1.9–3.7)
Glucose, Bld: 80 mg/dL (ref 65–99)
Potassium: 4.2 mmol/L (ref 3.5–5.3)
Sodium: 135 mmol/L (ref 135–146)
Total Bilirubin: 0.3 mg/dL (ref 0.2–1.2)
Total Protein: 7 g/dL (ref 6.1–8.1)
eGFR: 92 mL/min/1.73m2 (ref >=60)

## 2024-04-02 LAB — LIPID PANEL
Cholesterol: 154 mg/dL (ref <200)
HDL: 45 mg/dL — ABNORMAL LOW (ref >=50)
LDL Cholesterol (Calc): 95 mg/dL
Non-HDL Cholesterol (Calc): 109 mg/dL (ref <130)
Total CHOL/HDL Ratio: 3.4 (calc) (ref <5.0)
Triglycerides: 59 mg/dL (ref <150)

## 2024-04-02 LAB — HEMOGLOBIN A1C
Hgb A1c MFr Bld: 5.3 % (ref <5.7)
Mean Plasma Glucose: 105 mg/dL
eAG (mmol/L): 5.8 mmol/L

## 2024-04-02 LAB — VITAMIN D 25 HYDROXY (VIT D DEFICIENCY, FRACTURES): Vit D, 25-Hydroxy: 33 ng/mL (ref 30–100)

## 2024-04-04 ENCOUNTER — Ambulatory Visit: Payer: Self-pay | Admitting: Nurse Practitioner

## 2024-04-29 ENCOUNTER — Encounter: Payer: Self-pay | Admitting: Nurse Practitioner

## 2024-05-05 ENCOUNTER — Telehealth: Payer: Self-pay

## 2024-05-05 ENCOUNTER — Encounter: Payer: Self-pay | Admitting: Nurse Practitioner

## 2024-05-05 ENCOUNTER — Other Ambulatory Visit (HOSPITAL_COMMUNITY): Payer: Self-pay

## 2024-05-05 NOTE — Telephone Encounter (Signed)
 I contacted pharmacy and Rx is needing a prior auth.   Can we get a prior auth on semaglutide -weight management (WEGOVY ) 2.4 MG/0.75ML SOAJ SQ injection?

## 2024-05-05 NOTE — Telephone Encounter (Signed)
 Pharmacy Patient Advocate Encounter   Received notification from Onbase that prior authorization for Wegovy  2.4MG /0.75ML auto-injectors is required/requested.   Insurance verification completed.   The patient is insured through [facility].   Per test claim: PA required; PA submitted to above mentioned insurance via Latent Key/confirmation #/EOC [id number] Status is pending

## 2024-05-06 NOTE — Telephone Encounter (Signed)
 Prior auth pending

## 2024-05-09 NOTE — Telephone Encounter (Signed)
 Patient aware that prior auth approved.

## 2024-05-09 NOTE — Telephone Encounter (Signed)
 Pharmacy Patient Advocate Encounter  Received notification from [facility] that Prior Authorization for Wegovy  2.4MG /0.75ML auto-injectors  has been APPROVED from 05/06/2024 to 05/06/2025   PA #/Case ID/Reference #: [id number]

## 2024-05-09 NOTE — Telephone Encounter (Signed)
 I called and spoke with patient and notified her that prior auth of Wegovy  was completed and it was approved. Patient will call her pharmacy to make sure they have it in stock and will fill.

## 2024-05-11 ENCOUNTER — Other Ambulatory Visit (HOSPITAL_COMMUNITY): Payer: Self-pay

## 2024-06-26 ENCOUNTER — Other Ambulatory Visit: Payer: Self-pay | Admitting: Nurse Practitioner

## 2024-06-27 NOTE — Telephone Encounter (Signed)
 Requesting: WEGOVY  2.4 MG/0.75 ML PEN  Last Visit: 04/01/2024 Next Visit: 07/04/2024 Last Refill: 03/30/2024  Please Advise

## 2024-07-04 ENCOUNTER — Ambulatory Visit: Admitting: Nurse Practitioner

## 2024-07-04 VITALS — BP 106/70 | HR 80 | Temp 97.6°F | Ht 63.0 in | Wt 243.6 lb

## 2024-07-04 DIAGNOSIS — F411 Generalized anxiety disorder: Secondary | ICD-10-CM | POA: Diagnosis not present

## 2024-07-04 DIAGNOSIS — Z6841 Body Mass Index (BMI) 40.0 and over, adult: Secondary | ICD-10-CM

## 2024-07-04 NOTE — Patient Instructions (Signed)
It was great to see you!  Keep up the great work!   Let's follow-up in 3 months, sooner if you have concerns.  If a referral was placed today, you will be contacted for an appointment. Please note that routine referrals can sometimes take up to 3-4 weeks to process. Please call our office if you haven't heard anything after this time frame.  Take care,  Gordana Kewley, NP  

## 2024-07-04 NOTE — Assessment & Plan Note (Signed)
Chronic, stable.  Continue sertraline 50 mg daily.  She states that this is working really well for her.  Follow-up in 1 year or sooner with concerns.

## 2024-07-04 NOTE — Assessment & Plan Note (Signed)
 BMI 43.1. Ongoing management has resulted in an 8-pound weight loss. Appetite is suppressed with healthier food choices, though no exercise routine is established yet. Blood pressure is controlled. Continue Wegovy  2.4mg  weekly as prescribed. Encourage healthier food choices and portion control. Encourage physical activity, such as dance videos or walking. Follow-up appointment is scheduled in 3 months.

## 2024-07-04 NOTE — Progress Notes (Signed)
 "  Established Patient Office Visit  Subjective   Patient ID: Suzanne [name], female    DOB: 1991-03-06  Age: 34 y.o. MRN: [id number]  Chief Complaint  Patient presents with   Weight Managment    Follow up, no concerns    HPI  Discussed the use of AI scribe software for clinical note transcription with the patient, who gave verbal consent to proceed.  History of Present Illness   Suzanne [name] is a 34 year old female who presents for a follow-up visit regarding weight management and medication refills.  She has lost 8 pounds with improved diet and smaller portions, and her appetite remains suppressed, which helps weight control. She is not yet exercising regularly but plans to start this year. She is taking wegovy  2.4mg  weekly.   She recently had flu from her children at the start of winter break. Most symptoms have resolved, with only some residual drainage.  Her current medications are Wegovy , Zoloft , and Protonix . She denies chest pain or shortness of breath.       ROS See pertinent positives and negatives per HPI.    Objective:     BP 106/70 (BP Location: Left Arm, Patient Position: Sitting, Cuff Size: Normal)   Pulse 80   Temp 97.6 F (36.4 C)   Ht 5' 3 (1.6 m)   Wt 243 lb 9.6 oz (110.5 kg)   LMP 06/24/2024 (Exact Date)   SpO2 99%   BMI 43.15 kg/m  BP Readings from Last 3 Encounters:  07/04/24 106/70  04/01/24 114/80  12/31/23 122/80   Wt Readings from Last 3 Encounters:  07/04/24 243 lb 9.6 oz (110.5 kg)  04/01/24 251 lb (113.9 kg)  12/31/23 255 lb 12.8 oz (116 kg)      Physical Exam Vitals and nursing note reviewed.  Constitutional:      General: She is not in acute distress.    Appearance: Normal appearance.  HENT:     Head: Normocephalic.  Eyes:     Conjunctiva/sclera: Conjunctivae normal.  Cardiovascular:     Rate and Rhythm: Normal rate and regular rhythm.     Pulses: Normal pulses.     Heart sounds: Normal heart sounds.  Pulmonary:      Effort: Pulmonary effort is normal.     Breath sounds: Normal breath sounds.  Musculoskeletal:     Cervical back: Normal range of motion.  Skin:    General: Skin is warm.  Neurological:     General: No focal deficit present.     Mental Status: She is alert and oriented to person, place, and time.  Psychiatric:        Mood and Affect: Mood normal.        Behavior: Behavior normal.        Thought Content: Thought content normal.        Judgment: Judgment normal.      Assessment & Plan:   Problem List Items Addressed This Visit       Other   Obesity, morbid (HCC)   BMI 43.1. Ongoing management has resulted in an 8-pound weight loss. Appetite is suppressed with healthier food choices, though no exercise routine is established yet. Blood pressure is controlled. Continue Wegovy  2.4mg  weekly as prescribed. Encourage healthier food choices and portion control. Encourage physical activity, such as dance videos or walking. Follow-up appointment is scheduled in 3 months.      Generalized anxiety disorder - Primary   Chronic, stable.  Continue sertraline  50  mg daily.  She states that this is working really well for her.  Follow-up in 1 year or sooner with concerns.      Return in about 3 months (around 10/02/2024) for weight management .    Tinnie DELENA Harada, NP  "

## 2024-07-11 ENCOUNTER — Telehealth: Payer: Self-pay

## 2024-07-11 NOTE — Telephone Encounter (Signed)
 I called patient in regards to letter received that patient should enroll in [facility]. Patient aware and has received the same letter and has started the enrol process and will call.

## 2024-07-29 ENCOUNTER — Other Ambulatory Visit: Payer: Self-pay | Admitting: Nurse Practitioner

## 2024-07-29 NOTE — Telephone Encounter (Signed)
 Requesting: WEGOVY  2.4 MG/0.75 ML PEN  Last Visit: 07/04/2024 Next Visit: 10/04/2024 Last Refill: 06/27/2024  Please Advise

## 2024-10-04 ENCOUNTER — Ambulatory Visit: Admitting: Nurse Practitioner
